# Patient Record
Sex: Female | Born: 1970
Health system: Southern US, Community
[De-identification: ages and names within clinical notes are randomized; demographics above are authoritative.]

## PROBLEM LIST (undated history)

## (undated) DIAGNOSIS — E119 Type 2 diabetes mellitus without complications: Secondary | ICD-10-CM

## (undated) DIAGNOSIS — M1990 Unspecified osteoarthritis, unspecified site: Secondary | ICD-10-CM

## (undated) DIAGNOSIS — I10 Essential (primary) hypertension: Secondary | ICD-10-CM

## (undated) DIAGNOSIS — T7840XA Allergy, unspecified, initial encounter: Secondary | ICD-10-CM

## (undated) HISTORY — DX: Unspecified osteoarthritis, unspecified site: M19.90

## (undated) HISTORY — PX: NO PAST SURGERIES: SHX2092

## (undated) HISTORY — DX: Allergy, unspecified, initial encounter: T78.40XA

---

## 1996-02-07 DIAGNOSIS — I10 Essential (primary) hypertension: Secondary | ICD-10-CM | POA: Insufficient documentation

## 2000-08-19 DIAGNOSIS — E119 Type 2 diabetes mellitus without complications: Secondary | ICD-10-CM | POA: Insufficient documentation

## 2007-08-15 ENCOUNTER — Ambulatory Visit: Payer: Self-pay | Admitting: Gastroenterology

## 2007-08-15 LAB — HM COLONOSCOPY

## 2009-06-24 DIAGNOSIS — D509 Iron deficiency anemia, unspecified: Secondary | ICD-10-CM | POA: Insufficient documentation

## 2010-08-04 ENCOUNTER — Ambulatory Visit: Payer: Self-pay | Admitting: Family Medicine

## 2011-08-25 ENCOUNTER — Ambulatory Visit: Payer: Self-pay | Admitting: Family Medicine

## 2012-10-30 ENCOUNTER — Ambulatory Visit: Payer: Self-pay | Admitting: Family Medicine

## 2013-10-31 ENCOUNTER — Ambulatory Visit: Payer: Self-pay | Admitting: Family Medicine

## 2013-12-27 LAB — HM PAP SMEAR: HM Pap smear: NORMAL

## 2014-11-12 ENCOUNTER — Ambulatory Visit: Payer: Self-pay | Admitting: Family Medicine

## 2014-11-12 LAB — HM MAMMOGRAPHY

## 2014-12-30 LAB — BASIC METABOLIC PANEL
BUN: 11 mg/dL (ref 4–21)
Creatinine: 0.8 mg/dL (ref <=1.1)
GLUCOSE: 127 mg/dL
Potassium: 4.3 mmol/L (ref 3.4–5.3)
SODIUM: 138 mmol/L (ref 137–147)

## 2014-12-30 LAB — LIPID PANEL
Cholesterol: 193 mg/dL (ref 0–200)
HDL: 77 mg/dL — AB (ref 35–70)
LDL Cholesterol: 104 mg/dL
Triglycerides: 61 mg/dL (ref 40–160)

## 2014-12-30 LAB — CBC AND DIFFERENTIAL
HCT: 36 % (ref 36–46)
HEMOGLOBIN: 11.5 g/dL — AB (ref 12.0–16.0)
Platelets: 361 10*3/uL (ref 150–399)
WBC: 7.1 10*3/mL

## 2014-12-30 LAB — HEPATIC FUNCTION PANEL
ALT: 14 U/L (ref 7–35)
AST: 14 U/L (ref 13–35)

## 2014-12-30 LAB — HEMOGLOBIN A1C: Hgb A1c MFr Bld: 5.3 % (ref 4.0–6.0)

## 2014-12-30 LAB — TSH: TSH: 3.85 u[IU]/mL (ref <=5.90)

## 2015-04-29 DIAGNOSIS — IMO0002 Reserved for concepts with insufficient information to code with codable children: Secondary | ICD-10-CM | POA: Insufficient documentation

## 2015-04-29 DIAGNOSIS — J309 Allergic rhinitis, unspecified: Secondary | ICD-10-CM | POA: Insufficient documentation

## 2015-04-29 DIAGNOSIS — Z86718 Personal history of other venous thrombosis and embolism: Secondary | ICD-10-CM | POA: Insufficient documentation

## 2015-04-29 DIAGNOSIS — R202 Paresthesia of skin: Secondary | ICD-10-CM | POA: Insufficient documentation

## 2015-06-30 ENCOUNTER — Encounter: Payer: Self-pay | Admitting: Family Medicine

## 2015-06-30 ENCOUNTER — Ambulatory Visit (INDEPENDENT_AMBULATORY_CARE_PROVIDER_SITE_OTHER): Payer: BLUE CROSS/BLUE SHIELD | Admitting: Family Medicine

## 2015-06-30 VITALS — BP 120/78 | HR 92 | Temp 98.2°F | Resp 16 | Ht 64.0 in | Wt 228.0 lb

## 2015-06-30 DIAGNOSIS — I10 Essential (primary) hypertension: Secondary | ICD-10-CM | POA: Diagnosis not present

## 2015-06-30 DIAGNOSIS — E119 Type 2 diabetes mellitus without complications: Secondary | ICD-10-CM

## 2015-06-30 LAB — POCT GLYCOSYLATED HEMOGLOBIN (HGB A1C)
ESTIMATED AVERAGE GLUCOSE: 94
Hemoglobin A1C: 4.9

## 2015-06-30 NOTE — Progress Notes (Signed)
Subjective:    Patient ID: Carolyn Nolan, female    DOB: 12-20-1970, 44 y.o.   MRN: [id number]  Hypertension This is a chronic problem. The problem is controlled. Pertinent negatives include no anxiety, blurred vision, chest pain, headaches, malaise/fatigue, neck pain, orthopnea, palpitations, peripheral edema, shortness of breath or sweats. Risk factors for coronary artery disease include diabetes mellitus and obesity. Treatments tried: Ramipril 2.5 mg and Maxzide 37.5-25 mg. There are no compliance problems.   Diabetes She presents for her follow-up diabetic visit. She has type 2 diabetes mellitus. Her disease course has been stable. Pertinent negatives for hypoglycemia include no headaches or sweats. Pertinent negatives for diabetes include no blurred vision, no chest pain, no fatigue, no foot paresthesias, no foot ulcerations, no polydipsia, no polyphagia, no polyuria, no visual change, no weakness and no weight loss. Current diabetic treatment includes oral agent (triple therapy) (Metformin 1000 mg, Glipizide 5 mg and Januvia 100 mg). She participates in exercise weekly. Home blood sugar record trend: not being checked. She does not see a podiatrist.Eye exam is current.  Last A1C was checked on 12/20/2014, and the result was 5.3%.    Review of Systems  Constitutional: Negative for weight loss, malaise/fatigue and fatigue.  Eyes: Negative for blurred vision.  Respiratory: Negative for shortness of breath.   Cardiovascular: Negative for chest pain, palpitations and orthopnea.  Endocrine: Negative for polydipsia, polyphagia and polyuria.  Musculoskeletal: Negative for neck pain.  Neurological: Negative for weakness and headaches.   BP 120/78 mmHg  Pulse 92  Temp(Src) 98.2 F (36.8 C) (Oral)  Resp 16  Ht  (1.626 m)  Wt 228 lb (103.42 kg)  BMI 39.12 kg/m2  LMP 06/24/2015 (Exact Date)   Patient Active Problem List   Diagnosis Date Noted  . Allergic rhinitis 04/29/2015  . Adult  BMI 30+ 04/29/2015  . Deep vein thrombosis 04/29/2015  . Burning or prickling sensation 04/29/2015  . Anemia, iron deficiency 06/24/2009  . Diabetes mellitus, type 2 08/19/2000  . BP (high blood pressure) 02/07/1996   No past medical history on file. Current Outpatient Prescriptions on File Prior to Visit  Medication Sig  . aspirin 81 MG tablet BAYER LOW STRENGTH,  (Oral Tablet Delayed Release)  1 Every Day for 0 days  Quantity: 0.00;  Refills: 0   Ordered :05-Oct-2010  Amie Critchley ;  Started 09-May-2007 Active Comments: DX: 250.00  . Calcium Carbonate-Vitamin D 600-200 MG-UNIT CAPS CALCIUM + D, 600-200MG -UNIT (Oral Tablet)  1 Twice Daily for 0 days  Quantity: 0.00;  Refills: 0   Ordered :05-Oct-2010  Kavin Leech ;  Started 03-July-2010 Active  . Cetirizine HCl 10 MG CAPS Take by mouth.  . cyclobenzaprine (FLEXERIL) 5 MG tablet Take by mouth.  . fluticasone (FLONASE) 50 MCG/ACT nasal spray FLONASE, 50MCG/ACT (Nasal Suspension)  two sprays each nostril, as needed for 0 days  Quantity: 0.00;  Refills: 0   Ordered :05-Oct-2010  Amie Critchley ;  Started 09-May-2007 Active Comments: Medication taken as needed. DX: 477.9  . glipiZIDE (GLUCOTROL) 5 MG tablet Take by mouth.  . IRON-VITAMINS PO Take by mouth.  . meloxicam (MOBIC) 15 MG tablet Take by mouth.  . metFORMIN (GLUCOPHAGE) 1000 MG tablet Take by mouth.  . MULTIPLE VITAMIN PO MULTIVITAMINS (Oral Tablet)  1 Every Day for 0 days  Quantity: 0.00;  Refills: 0   Ordered :05-Oct-2010  Kavin Leech ;  Started 18-Mar-2010 Active Comments: DX: 250.00  . Potassium 99 MG TABS Take by mouth.  Marland Kitchen  ramipril (ALTACE) 2.5 MG capsule Take by mouth.  . sitaGLIPtin (JANUVIA) 100 MG tablet Take by mouth.  . triamterene-hydrochlorothiazide (MAXZIDE-25) 37.5-25 MG per tablet Take by mouth.   No current facility-administered medications on file prior to visit.   No Known Allergies Past Surgical History  Procedure Laterality Date  . No past  surgeries     History   Social History  . Marital Status: Single    Spouse Name: N/A  . Number of Children: N/A  . Years of Education: N/A   Occupational History  . Not on file.   Social History Main Topics  . Smoking status: Never Smoker   . Smokeless tobacco: Never Used  . Alcohol Use: Yes     Comment: Occasional Wine  . Drug Use: No  . Sexual Activity: Not on file   Other Topics Concern  . Not on file   Social History Narrative   Family History  Problem Relation Age of Onset  . Diabetes Mother   . Diabetes Father   . Hypertension Father        Objective:   Physical Exam  Constitutional: She is oriented to person, place, and time. She appears well-developed and well-nourished.  Cardiovascular: Normal rate and regular rhythm.   Pulmonary/Chest: Effort normal and breath sounds normal.  Neurological: She is alert and oriented to person, place, and time.  Psychiatric: She has a normal mood and affect. Her behavior is normal. Judgment and thought content normal.   BP 120/78 mmHg  Pulse 92  Temp(Src) 98.2 F (36.8 C) (Oral)  Resp 16  Ht 5\' 4"  (1.626 m)  Wt 228 lb (103.42 kg)  BMI 39.12 kg/m2  LMP 06/24/2015 (Exact Date)      Assessment & Plan:  1. Essential hypertension Stable. Continue current medication and plan of care.   2. Type 2 diabetes mellitus without complication Results for orders placed or performed in visit on 06/30/15  POCT glycosylated hemoglobin (Hb A1C)  Result Value Ref Range   Hemoglobin A1C 4.9    Est. average glucose Bld gHb Est-mCnc 94    Doing great. Will stop Glipizide. Recheck in 3 months.   Lorie PhenixNancy Nylene Inlow, MD

## 2015-09-05 ENCOUNTER — Other Ambulatory Visit: Payer: Self-pay | Admitting: Family Medicine

## 2015-09-05 DIAGNOSIS — E119 Type 2 diabetes mellitus without complications: Secondary | ICD-10-CM

## 2015-09-28 ENCOUNTER — Other Ambulatory Visit: Payer: Self-pay | Admitting: Family Medicine

## 2015-09-28 DIAGNOSIS — I10 Essential (primary) hypertension: Secondary | ICD-10-CM

## 2015-09-30 LAB — HM DIABETES EYE EXAM

## 2015-10-06 ENCOUNTER — Other Ambulatory Visit: Payer: Self-pay | Admitting: Family Medicine

## 2015-10-06 DIAGNOSIS — M940 Chondrocostal junction syndrome [Tietze]: Secondary | ICD-10-CM | POA: Insufficient documentation

## 2015-10-06 NOTE — Telephone Encounter (Signed)
LOV 06/30/2015. Allene DillonEmily Drozdowski, CMA

## 2015-12-10 ENCOUNTER — Telehealth: Payer: Self-pay | Admitting: Family Medicine

## 2015-12-10 ENCOUNTER — Other Ambulatory Visit: Payer: Self-pay

## 2015-12-10 NOTE — Telephone Encounter (Signed)
Received note from her pharmacy. New recommendation recommend she start a cholesterol medication secondary to her diabetes. Please see if patient would like to start a cholesterol medication to lower risk of heart disease or discuss at follow up.   Thanks.

## 2015-12-10 NOTE — Telephone Encounter (Signed)
Patient does not want to start cholesterol medication. sd

## 2015-12-10 NOTE — Telephone Encounter (Signed)
LMTCB. sd  

## 2015-12-19 ENCOUNTER — Other Ambulatory Visit: Payer: Self-pay | Admitting: Family Medicine

## 2016-01-02 ENCOUNTER — Encounter: Payer: Self-pay | Admitting: Family Medicine

## 2016-01-02 ENCOUNTER — Ambulatory Visit (INDEPENDENT_AMBULATORY_CARE_PROVIDER_SITE_OTHER): Payer: Managed Care, Other (non HMO) | Admitting: Family Medicine

## 2016-01-02 VITALS — BP 120/78 | HR 96 | Temp 97.9°F | Resp 16 | Ht 64.0 in | Wt 240.0 lb

## 2016-01-02 DIAGNOSIS — E119 Type 2 diabetes mellitus without complications: Secondary | ICD-10-CM | POA: Diagnosis not present

## 2016-01-02 DIAGNOSIS — Z1211 Encounter for screening for malignant neoplasm of colon: Secondary | ICD-10-CM | POA: Diagnosis not present

## 2016-01-02 DIAGNOSIS — J3089 Other allergic rhinitis: Secondary | ICD-10-CM | POA: Diagnosis not present

## 2016-01-02 DIAGNOSIS — Z1239 Encounter for other screening for malignant neoplasm of breast: Secondary | ICD-10-CM | POA: Diagnosis not present

## 2016-01-02 DIAGNOSIS — Z0000 Encounter for general adult medical examination without abnormal findings: Secondary | ICD-10-CM

## 2016-01-02 DIAGNOSIS — I10 Essential (primary) hypertension: Secondary | ICD-10-CM | POA: Diagnosis not present

## 2016-01-02 LAB — POCT URINALYSIS DIPSTICK
BILIRUBIN UA: NEGATIVE
Blood, UA: NEGATIVE
GLUCOSE UA: NEGATIVE
Ketones, UA: NEGATIVE
LEUKOCYTES UA: NEGATIVE
NITRITE UA: NEGATIVE
Protein, UA: NEGATIVE
Spec Grav, UA: <=1.005
Urobilinogen, UA: 0.2
pH, UA: 6

## 2016-01-02 LAB — IFOBT (OCCULT BLOOD): IFOBT: NEGATIVE

## 2016-01-02 MED ORDER — MONTELUKAST SODIUM 10 MG PO TABS: 10.0000 mg | ORAL_TABLET | Freq: Every day | ORAL | Status: DC

## 2016-01-02 NOTE — Patient Instructions (Signed)
Please call the [facility] at [facility] to schedule this at [phone]   

## 2016-01-02 NOTE — Progress Notes (Signed)
Patient ID: Carolyn Nolan, female   DOB: 1971/08/29, 45 y.o.   MRN: [id number]       Patient: Carolyn Nolan, Female    DOB: [birth year], 45 y.o.   MRN: [id number] Visit Date: 01/02/2016  Today's Provider: Lorie Phenix, MD   Chief Complaint  Patient presents with  . Annual Exam  . Diabetes   Subjective:    Annual physical exam Carolyn Nolan is a 45 y.o. female who presents today for health maintenance and complete physical. She feels well. She reports exercising daily. She reports she is sleeping fairly well. 12/30/14 CPE 12/27/13 Pap-neg; HPV-neg 11/12/14 Mammo-BI-RADS 1 08/15/07 Colon-int hemorrhoids  Lab Results  Component Value Date   WBC 7.1 12/30/2014   HGB 11.5* 12/30/2014   HCT 36 12/30/2014   PLT 361 12/30/2014   CHOL 193 12/30/2014   TRIG 61 12/30/2014   HDL 77* 12/30/2014   LDLCALC 104 12/30/2014   ALT 14 12/30/2014   AST 14 12/30/2014   NA 138 12/30/2014   K 4.3 12/30/2014   CREATININE 0.8 12/30/2014   BUN 11 12/30/2014   TSH 3.85 12/30/2014   HGBA1C 4.9 06/30/2015   -----------------------------------------------------------------  Diabetes Mellitus Type II, Follow-up:   Lab Results  Component Value Date   HGBA1C 4.9 06/30/2015   HGBA1C 5.3 12/30/2014    Last seen for diabetes 6 months ago.  Management since then includes no changes. She reports excellent compliance with treatment. She is not having side effects.  Current symptoms include none and have been stable. Home blood sugar records: not being checked  Episodes of hypoglycemia? no   Current Insulin Regimen: none Most Recent Eye Exam: ut to date Weight trend: stable Prior visit with dietician: no Current diet: in general, a "healthy" diet   Current exercise: walking  Pertinent Labs:    Component Value Date/Time   CHOL 193 12/30/2014   TRIG 61 12/30/2014   HDL 77* 12/30/2014   LDLCALC 104 12/30/2014   CREATININE 0.8 12/30/2014    Wt Readings from Last 3 Encounters:    01/02/16 240 lb (108.863 kg)  06/30/15 228 lb (103.42 kg)  12/30/14 225 lb (102.059 kg)    ------------------------------------------------------------------------     Review of Systems  Constitutional: Negative.   HENT: Positive for sinus pressure.   Eyes: Negative.   Respiratory: Negative.   Cardiovascular: Negative.   Gastrointestinal: Negative.   Endocrine: Positive for cold intolerance.  Genitourinary: Negative.   Musculoskeletal: Negative.   Skin: Negative.   Allergic/Immunologic: Positive for environmental allergies.  Neurological: Negative.   Hematological: Negative.   Psychiatric/Behavioral: Negative.     Social History      She  reports that she has never smoked. She has never used smokeless tobacco. She reports that she drinks alcohol. She reports that she does not use illicit drugs.       Social History   Social History  . Marital Status: Single    Spouse Name: N/A  . Number of Children: N/A  . Years of Education: N/A   Social History Main Topics  . Smoking status: Never Smoker   . Smokeless tobacco: Never Used  . Alcohol Use: Yes     Comment: Occasional Wine  . Drug Use: No  . Sexual Activity: Not Asked   Other Topics Concern  . None   Social History Narrative    History reviewed. No pertinent past medical history.   Patient Active Problem List   Diagnosis Date Noted  . Diabetes  mellitus without complication (HCC) 01/02/2016  . Costalchondritis 10/06/2015  . Allergic rhinitis 04/29/2015  . Adult BMI 30+ 04/29/2015  . Deep vein thrombosis (HCC) 04/29/2015  . Burning or prickling sensation 04/29/2015  . Anemia, iron deficiency 06/24/2009  . Diabetes mellitus, type 2 (HCC) 08/19/2000  . BP (high blood pressure) 02/07/1996    Past Surgical History  Procedure Laterality Date  . No past surgeries      Family History        Family Status  Relation Status Death Age  . Mother Alive   . Father Deceased   . Sister Alive         Her  family history includes Diabetes in her father and mother; Healthy in her sister; Hypertension in her father.    No Known Allergies  Previous Medications   ASPIRIN 81 MG TABLET    BAYER LOW STRENGTH,  (Oral Tablet Delayed Release)  1 Every Day for 0 days  Quantity: 0.00;  Refills: 0   Ordered :05-Oct-2010  Amie Critchley ;  Started 09-May-2007 Active Comments: DX: 250.00   CALCIUM CARBONATE-VITAMIN D 600-200 MG-UNIT CAPS    CALCIUM + D, 600-200MG -UNIT (Oral Tablet)  1 Twice Daily for 0 days  Quantity: 0.00;  Refills: 0   Ordered :05-Oct-2010  Kavin Leech ;  Started 03-July-2010 Active   CETIRIZINE HCL 10 MG CAPS    Take by mouth.   CYCLOBENZAPRINE (FLEXERIL) 5 MG TABLET    take 1 tablet by mouth at bedtime if needed for MUSCLE SPASMS   FLUTICASONE (FLONASE) 50 MCG/ACT NASAL SPRAY    FLONASE, 50MCG/ACT (Nasal Suspension)  two sprays each nostril, as needed for 0 days  Quantity: 0.00;  Refills: 0   Ordered :05-Oct-2010  Amie Critchley ;  Started 09-May-2007 Active Comments: Medication taken as needed. DX: 477.9   IRON-VITAMINS PO    Take by mouth.   JANUVIA 100 MG TABLET    take 1 tablet by mouth once daily   MELOXICAM (MOBIC) 15 MG TABLET    take 1 tablet by mouth once daily if needed   METFORMIN (GLUCOPHAGE) 1000 MG TABLET    take 1 tablet by mouth twice a day   MULTIPLE VITAMIN PO    MULTIVITAMINS (Oral Tablet)  1 Every Day for 0 days  Quantity: 0.00;  Refills: 0   Ordered :05-Oct-2010  Kavin Leech ;  Started 18-Mar-2010 Active Comments: DX: 250.00   POTASSIUM 99 MG TABS    Take by mouth.   RAMIPRIL (ALTACE) 2.5 MG CAPSULE    take 1 capsule by mouth daily   TRIAMTERENE-HYDROCHLOROTHIAZIDE (MAXZIDE-25) 37.5-25 MG PER TABLET    Take by mouth.    Patient Care Team: Lorie Phenix, MD as PCP - General (Family Medicine)     Objective:   Vitals: BP 120/78 mmHg  Pulse 96  Temp(Src) 97.9 F (36.6 C) (Oral)  Resp 16  Ht  (1.626 m)  Wt 240 lb (108.863 kg)  BMI 41.18 kg/m2   SpO2 100%  LMP 12/27/2015 (Exact Date)   Physical Exam  Constitutional: She is oriented to person, place, and time. She appears well-developed and well-nourished.  HENT:  Head: Normocephalic and atraumatic.  Right Ear: Tympanic membrane, external ear and ear canal normal.  Left Ear: Tympanic membrane, external ear and ear canal normal.  Nose: Mucosal edema present.  Mouth/Throat: Uvula is midline, oropharynx is clear and moist and mucous membranes are normal.  Eyes: Conjunctivae, EOM and lids are normal. Pupils are equal,  round, and reactive to light.  Neck: Trachea normal and normal range of motion. Neck supple. Carotid bruit is not present. No thyroid mass and no thyromegaly present.  Cardiovascular: Normal rate, regular rhythm and normal heart sounds.   Pulmonary/Chest: Effort normal and breath sounds normal.  Abdominal: Soft. Normal appearance and bowel sounds are normal. There is no hepatosplenomegaly. There is no tenderness.  Genitourinary: Rectum normal. No breast swelling, tenderness or discharge.  Musculoskeletal: Normal range of motion.  Lymphadenopathy:    She has no cervical adenopathy.    She has no axillary adenopathy.  Neurological: She is alert and oriented to person, place, and time. She has normal strength. No cranial nerve deficit.  Skin: Skin is warm, dry and intact.  Psychiatric: She has a normal mood and affect. Her speech is normal and behavior is normal. Judgment and thought content normal. Cognition and memory are normal.     Depression Screen PHQ 2/9 Scores 01/02/2016  PHQ - 2 Score 1      Assessment & Plan:     Routine Health Maintenance and Physical Exam  Exercise Activities and Dietary recommendations Goals    . Exercise 150 minutes per week (moderate activity)       Immunization History  Administered Date(s) Administered  . Influenza-Unspecified 10/14/2015  . Pneumococcal Polysaccharide-23 10/15/2011  . Tdap 03/10/2006      1. Annual  physical exam Stable. Patient advised to continue eating healthy and exercise daily. - POCT urinalysis dipstick  2. Diabetes mellitus without complication (HCC) Stable. Patient advised to continue current medication and plan of care.  Diabetic Foot Exam - Simple   Simple Foot Form  Diabetic Foot exam was performed with the following findings:  Yes 01/02/2016  9:38 AM  Visual Inspection  No deformities, no ulcerations, no other skin breakdown bilaterally:  Yes  Sensation Testing  Intact to touch and monofilament testing bilaterally:  Yes  Pulse Check  Posterior Tibialis and Dorsalis pulse intact bilaterally:  Yes  Comments    - Hemoglobin A1c - TSH  3. Other allergic rhinitis Worsening. Patient started on montelukast 10 mg daily as below. - montelukast (SINGULAIR) 10 MG tablet; Take 1 tablet (10 mg total) by mouth at bedtime.  Dispense: 30 tablet; Refill: 3  4. Breast cancer screening - MM Digital Diagnostic Bilat; Future  5. Colon cancer screening - IFOBT POC (occult bld, rslt in office)  6. Essential hypertension - CBC with Differential/Platelet - Comprehensive metabolic panel - Lipid Panel With LDL/HDL Ratio     Patient seen and examined by Dr. Leo Grosser, and note scribed by Liz Beach. Dimas, CMA. I have reviewed the document for accuracy and completeness and I agree with above. Leo Grosser, MD   Lorie Phenix, MD    --------------------------------------------------------------------

## 2016-01-03 LAB — CBC WITH DIFFERENTIAL/PLATELET
BASOS ABS: 0 10*3/uL (ref 0.0–0.2)
Basos: 0 %
EOS (ABSOLUTE): 0.1 10*3/uL (ref 0.0–0.4)
Eos: 1 %
HEMOGLOBIN: 10.8 g/dL — AB (ref 11.1–15.9)
Hematocrit: 33.1 % — ABNORMAL LOW (ref 34.0–46.6)
IMMATURE GRANS (ABS): 0 10*3/uL (ref 0.0–0.1)
IMMATURE GRANULOCYTES: 0 %
LYMPHS: 25 %
Lymphocytes Absolute: 1.9 10*3/uL (ref 0.7–3.1)
MCH: 28.4 pg (ref 26.6–33.0)
MCHC: 32.6 g/dL (ref 31.5–35.7)
MCV: 87 fL (ref 79–97)
MONOCYTES: 8 %
Monocytes Absolute: 0.6 10*3/uL (ref 0.1–0.9)
NEUTROS ABS: 5 10*3/uL (ref 1.4–7.0)
NEUTROS PCT: 66 %
PLATELETS: 406 10*3/uL — AB (ref 150–379)
RBC: 3.8 x10E6/uL (ref 3.77–5.28)
RDW: 13.4 % (ref 12.3–15.4)
WBC: 7.4 10*3/uL (ref 3.4–10.8)

## 2016-01-03 LAB — COMPREHENSIVE METABOLIC PANEL
A/G RATIO: 1.3 (ref 1.1–2.5)
ALT: 24 IU/L (ref 0–32)
AST: 20 IU/L (ref 0–40)
Albumin: 4 g/dL (ref 3.5–5.5)
Alkaline Phosphatase: 50 IU/L (ref 39–117)
BILIRUBIN TOTAL: 0.2 mg/dL (ref 0.0–1.2)
BUN/Creatinine Ratio: 16 (ref 9–23)
BUN: 13 mg/dL (ref 6–24)
CHLORIDE: 100 mmol/L (ref 96–106)
CO2: 25 mmol/L (ref 18–29)
Calcium: 9.4 mg/dL (ref 8.7–10.2)
Creatinine, Ser: 0.82 mg/dL (ref 0.57–1.00)
GFR calc Af Amer: 100 mL/min/{1.73_m2} (ref >59)
GFR calc non Af Amer: 87 mL/min/{1.73_m2} (ref >59)
GLUCOSE: 111 mg/dL — AB (ref 65–99)
Globulin, Total: 3.2 g/dL (ref 1.5–4.5)
POTASSIUM: 3.9 mmol/L (ref 3.5–5.2)
Sodium: 141 mmol/L (ref 134–144)
Total Protein: 7.2 g/dL (ref 6.0–8.5)

## 2016-01-03 LAB — LIPID PANEL WITH LDL/HDL RATIO
CHOLESTEROL TOTAL: 187 mg/dL (ref 100–199)
HDL: 75 mg/dL (ref >39)
LDL Calculated: 101 mg/dL — ABNORMAL HIGH (ref 0–99)
LDl/HDL Ratio: 1.3 ratio units (ref 0.0–3.2)
Triglycerides: 56 mg/dL (ref 0–149)
VLDL Cholesterol Cal: 11 mg/dL (ref 5–40)

## 2016-01-03 LAB — HEMOGLOBIN A1C
ESTIMATED AVERAGE GLUCOSE: 120 mg/dL
HEMOGLOBIN A1C: 5.8 % — AB (ref 4.8–5.6)

## 2016-01-03 LAB — TSH: TSH: 1.61 u[IU]/mL (ref 0.450–4.500)

## 2016-01-05 ENCOUNTER — Ambulatory Visit
Admission: RE | Admit: 2016-01-05 | Discharge: 2016-01-05 | Disposition: A | Discharge status: Home / Self Care | Payer: Managed Care, Other (non HMO) | Source: Ambulatory Visit | Attending: Family Medicine | Admitting: Family Medicine

## 2016-01-05 ENCOUNTER — Telehealth: Payer: Self-pay

## 2016-01-05 DIAGNOSIS — Z1239 Encounter for other screening for malignant neoplasm of breast: Secondary | ICD-10-CM

## 2016-01-05 DIAGNOSIS — Z1231 Encounter for screening mammogram for malignant neoplasm of breast: Secondary | ICD-10-CM | POA: Diagnosis present

## 2016-01-05 NOTE — Telephone Encounter (Signed)
Advised pt of lab results. Pt verbally acknowledges understanding. Pt is taking iron. Allene Dillon, CMA

## 2016-01-05 NOTE — Telephone Encounter (Signed)
-----   Message from Lorie Phenix, MD sent at 01/04/2016  9:07 AM EST ----- Labs look good. HgbA1c is 5.8. Mildly anemic.  Make sure she is taking her iron.   Thanks.

## 2016-01-27 ENCOUNTER — Other Ambulatory Visit: Payer: Self-pay | Admitting: Family Medicine

## 2016-01-27 DIAGNOSIS — I10 Essential (primary) hypertension: Secondary | ICD-10-CM

## 2016-02-20 ENCOUNTER — Other Ambulatory Visit: Payer: Self-pay | Admitting: Family Medicine

## 2016-02-20 DIAGNOSIS — E119 Type 2 diabetes mellitus without complications: Secondary | ICD-10-CM

## 2016-03-26 ENCOUNTER — Other Ambulatory Visit: Payer: Self-pay | Admitting: Family Medicine

## 2016-03-26 DIAGNOSIS — I10 Essential (primary) hypertension: Secondary | ICD-10-CM

## 2016-04-01 ENCOUNTER — Encounter: Payer: Self-pay | Admitting: Family Medicine

## 2016-04-01 ENCOUNTER — Ambulatory Visit (INDEPENDENT_AMBULATORY_CARE_PROVIDER_SITE_OTHER): Payer: Managed Care, Other (non HMO) | Admitting: Family Medicine

## 2016-04-01 VITALS — BP 126/80 | HR 84 | Temp 98.5°F | Resp 16 | Wt 239.0 lb

## 2016-04-01 DIAGNOSIS — E119 Type 2 diabetes mellitus without complications: Secondary | ICD-10-CM | POA: Diagnosis not present

## 2016-04-01 DIAGNOSIS — I10 Essential (primary) hypertension: Secondary | ICD-10-CM | POA: Diagnosis not present

## 2016-04-01 DIAGNOSIS — M722 Plantar fascial fibromatosis: Secondary | ICD-10-CM

## 2016-04-01 LAB — POCT GLYCOSYLATED HEMOGLOBIN (HGB A1C): Hemoglobin A1C: 5.4

## 2016-04-01 NOTE — Progress Notes (Signed)
Patient ID: Carolyn Nolan, female   DOB: Mar 08, 1971, 45 y.o.   MRN: [id number]         Patient: Carolyn Nolan Female    DOB: 05/22/1971   45 y.o.   MRN: [id number] Visit Date: 04/01/2016  Today's Provider: Lorie Phenix, MD   Chief Complaint  Patient presents with  . Hypertension  . Diabetes   Subjective:    HPI    Diabetes Mellitus Type II, Follow-up:   Lab Results  Component Value Date   HGBA1C 5.8* 01/02/2016   HGBA1C 4.9 06/30/2015   HGBA1C 5.3 12/30/2014   Last seen for diabetes 3 months ago.  Management since then includes None. She reports excellent compliance with treatment. She is not having side effects.  Current symptoms include none and have been stable. Home blood sugar records: Not being checked at home.   Episodes of hypoglycemia? no   Most Recent Eye Exam: 09/2015 Weight trend: stable Current diet: in general, a "healthy" diet   Current exercise: Has not been exercising in the last month; but plans on walking soon.   ------------------------------------------------------------------------   Hypertension, follow-up:  BP Readings from Last 3 Encounters:  04/01/16 126/80  01/02/16 120/78  06/30/15 120/78    She was last seen for hypertension 3 months ago.  Management since that visit includes None .She reports excellent compliance with treatment. She is not having side effects.  She is not exercising. She is adherent to low salt diet.   She is experiencing none.  Patient denies chest pain, fatigue, lower extremity edema, palpitations and tachypnea.   ------------------------------------------------------------------------      Pt also reports having trouble with bilateral feet pain.  She states the pain comes and goes; and is located on the bottom of her feet.  She is concerned that is could be plantar fasciitis.        No Known Allergies Previous Medications   ASPIRIN 81 MG TABLET    BAYER LOW STRENGTH,  (Oral Tablet Delayed  Release)  1 Every Day for 0 days  Quantity: 0.00;  Refills: 0   Ordered :05-Oct-2010  Amie Critchley ;  Started 09-May-2007 Active Comments: DX: 250.00   CALCIUM CARBONATE-VITAMIN D 600-200 MG-UNIT CAPS    CALCIUM + D, 600-200MG -UNIT (Oral Tablet)  1 Twice Daily for 0 days  Quantity: 0.00;  Refills: 0   Ordered :05-Oct-2010  Kavin Leech ;  Started 03-July-2010 Active   CETIRIZINE HCL 10 MG CAPS    Take by mouth.   CYCLOBENZAPRINE (FLEXERIL) 5 MG TABLET    take 1 tablet by mouth at bedtime if needed for MUSCLE SPASMS   FLUTICASONE (FLONASE) 50 MCG/ACT NASAL SPRAY    FLONASE, 50MCG/ACT (Nasal Suspension)  two sprays each nostril, as needed for 0 days  Quantity: 0.00;  Refills: 0   Ordered :05-Oct-2010  Amie Critchley ;  Started 09-May-2007 Active Comments: Medication taken as needed. DX: 477.9   IRON-VITAMINS PO    Take by mouth.   JANUVIA 100 MG TABLET    take 1 tablet by mouth once daily   MELOXICAM (MOBIC) 15 MG TABLET    take 1 tablet by mouth once daily if needed   METFORMIN (GLUCOPHAGE) 1000 MG TABLET    take 1 tablet by mouth twice a day with food   MONTELUKAST (SINGULAIR) 10 MG TABLET    Take 1 tablet (10 mg total) by mouth at bedtime.   MULTIPLE VITAMIN PO    MULTIVITAMINS (Oral Tablet)  1 Every  Day for 0 days  Quantity: 0.00;  Refills: 0   Ordered :05-Oct-2010  Kavin LeechWalsh, Laura ;  Started 18-Mar-2010 Active Comments: DX: 250.00   POTASSIUM 99 MG TABS    Take by mouth.   RAMIPRIL (ALTACE) 2.5 MG CAPSULE    take 1 capsule by mouth once daily   TRIAMTERENE-HYDROCHLOROTHIAZIDE (MAXZIDE-25) 37.5-25 MG TABLET    take 1 tablet by mouth once daily    Review of Systems  Constitutional: Negative.   Respiratory: Negative.   Cardiovascular: Negative.   Gastrointestinal: Negative.   Musculoskeletal: Positive for arthralgias (Bilateral feet pain.  ). Negative for myalgias, back pain, joint swelling, gait problem and neck stiffness.  Neurological: Negative for light-headedness.    Social  History  Substance Use Topics  . Smoking status: Never Smoker   . Smokeless tobacco: Never Used  . Alcohol Use: Yes     Comment: Occasional Wine   Objective:   BP 126/80 mmHg  Pulse 84  Temp(Src) 98.5 F (36.9 C) (Oral)  Resp 16  Wt 239 lb (108.41 kg)  Results for orders placed or performed in visit on 04/01/16  POCT glycosylated hemoglobin (Hb A1C)  Result Value Ref Range   Hemoglobin A1C 5.4      Physical Exam  Constitutional: She is oriented to person, place, and time. She appears well-developed and well-nourished.  Cardiovascular: Normal rate and regular rhythm.   Pulmonary/Chest: Effort normal and breath sounds normal.  Neurological: She is alert and oriented to person, place, and time.  Skin: Skin is warm and dry.  Psychiatric: She has a normal mood and affect. Her behavior is normal. Judgment and thought content normal.        Assessment & Plan:      1. Essential hypertension Stable; continue current medication. New p  2. Diabetes mellitus without complication (HCC) A1C stable at 5.4%;  Recheck in four months with Antony ContrasJenni. - POCT glycosylated hemoglobin (Hb A1C)  3. Plantar fasciitis, bilateral New problem  Worsening; gave handout on exercises.         Patient was seen and examined by Leo GrosserNancy J. Emonie Espericueta, MD, and note scribed by Kavin LeechLaura Walsh, CMA. Patient was seen and examined by Leo GrosserNancy J. Jakylan Ron, MD, and note scribed by Kavin LeechLaura Walsh, CMA.     Lorie PhenixNancy Ovella Manygoats, MD  [facility]

## 2016-04-01 NOTE — Patient Instructions (Signed)

## 2016-04-23 ENCOUNTER — Other Ambulatory Visit: Payer: Self-pay | Admitting: Family Medicine

## 2016-04-23 DIAGNOSIS — J3089 Other allergic rhinitis: Secondary | ICD-10-CM

## 2016-08-02 ENCOUNTER — Encounter: Payer: Self-pay | Admitting: Physician Assistant

## 2016-08-02 ENCOUNTER — Ambulatory Visit (INDEPENDENT_AMBULATORY_CARE_PROVIDER_SITE_OTHER): Payer: Managed Care, Other (non HMO) | Admitting: Physician Assistant

## 2016-08-02 VITALS — BP 136/98 | HR 90 | Temp 98.0°F | Resp 16 | Wt 253.0 lb

## 2016-08-02 DIAGNOSIS — E119 Type 2 diabetes mellitus without complications: Secondary | ICD-10-CM

## 2016-08-02 LAB — POCT GLYCOSYLATED HEMOGLOBIN (HGB A1C): HEMOGLOBIN A1C: 5.6

## 2016-08-02 NOTE — Patient Instructions (Signed)

## 2016-08-02 NOTE — Progress Notes (Signed)
Patient: Carolyn MeiersStacy G Mecca Female    DOB: 10-17-1971   45 y.o.   MRN: [id number] Visit Date: 08/02/2016  Today's Provider: Margaretann LovelessJennifer M Burnette, PA-C   Chief Complaint  Patient presents with  . Diabetes  . Hypertension  . Follow-up   Subjective:    HPI  Diabetes Mellitus Type II, Follow-up:   Lab Results  Component Value Date   HGBA1C 5.6 08/02/2016   HGBA1C 5.4 04/01/2016   HGBA1C 5.8 (H) 01/02/2016   Last seen for diabetes 4 months ago.  Management since then includes continue Metformin and Januvia. She reports good compliance with treatment. She is not having side effects.  Current symptoms include none and have been stable. Home blood sugar records: not being checked  Episodes of hypoglycemia? unknown    Weight trend: stable Current diet: well balanced Current exercise: lightly  ------------------------------------------------------------------------   Hypertension, follow-up:  BP Readings from Last 3 Encounters:  08/02/16 (!) 136/98  04/01/16 126/80  01/02/16 120/78    She was last seen for hypertension 4 months ago.  BP at that visit was 126/80. Management since that visit includes continue maxzide .She reports good compliance with treatment. She is not having side effects.  She lightly exercising. She is adherent to low salt diet.   Outside blood pressures are not being checked. She is experiencing none.  Patient denies none.   Cardiovascular risk factors include diabetes mellitus, hypertension and obesity (BMI >= 30 kg/m2).  Use of agents associated with hypertension: none.   ------------------------------------------------------------------------      Previous Medications   ASPIRIN 81 MG TABLET    BAYER LOW STRENGTH, 81MG  (Oral Tablet Delayed Release)  1 Every Day for 0 days  Quantity: 0.00;  Refills: 0   Ordered :05-Oct-2010  Amie CritchleyMitchell, Stephanie ;  Started 09-May-2007 Active Comments: DX: 250.00   CALCIUM CARBONATE-VITAMIN D 600-200 MG-UNIT  CAPS    CALCIUM + D, 600-200MG -UNIT (Oral Tablet)  1 Twice Daily for 0 days  Quantity: 0.00;  Refills: 0   Ordered :05-Oct-2010  Kavin LeechWalsh, Laura ;  Started 03-July-2010 Active   CETIRIZINE HCL 10 MG CAPS    Take by mouth.   CYCLOBENZAPRINE (FLEXERIL) 5 MG TABLET    take 1 tablet by mouth at bedtime if needed for MUSCLE SPASMS   FLUTICASONE (FLONASE) 50 MCG/ACT NASAL SPRAY    FLONASE, 50MCG/ACT (Nasal Suspension)  two sprays each nostril, as needed for 0 days  Quantity: 0.00;  Refills: 0   Ordered :05-Oct-2010  Amie CritchleyMitchell, Stephanie ;  Started 09-May-2007 Active Comments: Medication taken as needed. DX: 477.9   IRON-VITAMINS PO    Take by mouth.   JANUVIA 100 MG TABLET    take 1 tablet by mouth once daily   MELOXICAM (MOBIC) 15 MG TABLET    take 1 tablet by mouth once daily if needed   METFORMIN (GLUCOPHAGE) 1000 MG TABLET    take 1 tablet by mouth twice a day with food   MONTELUKAST (SINGULAIR) 10 MG TABLET    take 1 tablet by mouth at bedtime   MULTIPLE VITAMIN PO    MULTIVITAMINS (Oral Tablet)  1 Every Day for 0 days  Quantity: 0.00;  Refills: 0   Ordered :05-Oct-2010  Kavin LeechWalsh, Laura ;  Started 18-Mar-2010 Active Comments: DX: 250.00   POTASSIUM 99 MG TABS    Take by mouth.   RAMIPRIL (ALTACE) 2.5 MG CAPSULE    take 1 capsule by mouth once daily   TRIAMTERENE-HYDROCHLOROTHIAZIDE (MAXZIDE-25) 37.5-25 MG TABLET  take 1 tablet by mouth once daily    Review of Systems  Constitutional: Negative.   Respiratory: Negative.   Cardiovascular: Negative.   Gastrointestinal: Negative.   Endocrine: Negative.     Social History  Substance Use Topics  . Smoking status: Never Smoker  . Smokeless tobacco: Never Used  . Alcohol use Yes     Comment: Occasional Wine   Objective:   BP (!) 136/98 (BP Location: Right Arm, Patient Position: Sitting, Cuff Size: Large)   Pulse 90   Temp 98 F (36.7 C) (Oral)   Resp 16   Wt 253 lb (114.8 kg)   SpO2 99%   BMI 43.43 kg/m   Physical Exam  Constitutional: She  appears well-developed and well-nourished. No distress.  Neck: Normal range of motion. Neck supple. No JVD present. No tracheal deviation present. No thyromegaly present.  Cardiovascular: Normal rate, regular rhythm and normal heart sounds.  Exam reveals no gallop and no friction rub.   No murmur heard. Pulmonary/Chest: Effort normal and breath sounds normal. No respiratory distress. She has no wheezes. She has no rales.  Musculoskeletal: She exhibits no edema.  Lymphadenopathy:    She has no cervical adenopathy.  Skin: She is not diaphoretic.  Psychiatric: She has a normal mood and affect. Her behavior is normal. Judgment and thought content normal.  Vitals reviewed.     Assessment & Plan:     1. Type 2 diabetes mellitus without complication, without long-term current use of insulin (HCC) Fairly stable. Slight increase to 5.6 from 5.4. Discussed lifestyle modifications. Will see her back in January 2018 for CPE and all labs. She is to call the office if she develops any acute issues, questions and concerns.  - POCT HgB A1C   Follow up: No Follow-up on file.

## 2016-08-16 ENCOUNTER — Other Ambulatory Visit: Payer: Self-pay | Admitting: Family Medicine

## 2016-08-16 DIAGNOSIS — E119 Type 2 diabetes mellitus without complications: Secondary | ICD-10-CM

## 2016-08-29 ENCOUNTER — Other Ambulatory Visit: Payer: Self-pay | Admitting: Family Medicine

## 2016-08-29 DIAGNOSIS — I10 Essential (primary) hypertension: Secondary | ICD-10-CM

## 2016-09-29 ENCOUNTER — Other Ambulatory Visit: Payer: Self-pay | Admitting: Family Medicine

## 2016-09-29 DIAGNOSIS — I10 Essential (primary) hypertension: Secondary | ICD-10-CM

## 2016-10-07 ENCOUNTER — Encounter: Payer: Self-pay | Admitting: Physician Assistant

## 2016-11-21 ENCOUNTER — Other Ambulatory Visit: Payer: Self-pay | Admitting: Family Medicine

## 2017-01-03 ENCOUNTER — Ambulatory Visit (INDEPENDENT_AMBULATORY_CARE_PROVIDER_SITE_OTHER): Payer: Managed Care, Other (non HMO) | Admitting: Physician Assistant

## 2017-01-03 ENCOUNTER — Encounter: Payer: Self-pay | Admitting: Physician Assistant

## 2017-01-03 VITALS — BP 130/86 | HR 68 | Temp 97.6°F | Resp 16 | Wt 270.0 lb

## 2017-01-03 DIAGNOSIS — Z124 Encounter for screening for malignant neoplasm of cervix: Secondary | ICD-10-CM | POA: Diagnosis not present

## 2017-01-03 DIAGNOSIS — Z1231 Encounter for screening mammogram for malignant neoplasm of breast: Secondary | ICD-10-CM | POA: Diagnosis not present

## 2017-01-03 DIAGNOSIS — Z0000 Encounter for general adult medical examination without abnormal findings: Secondary | ICD-10-CM | POA: Diagnosis not present

## 2017-01-03 DIAGNOSIS — Z114 Encounter for screening for human immunodeficiency virus [HIV]: Secondary | ICD-10-CM | POA: Diagnosis not present

## 2017-01-03 DIAGNOSIS — E119 Type 2 diabetes mellitus without complications: Secondary | ICD-10-CM

## 2017-01-03 DIAGNOSIS — Z23 Encounter for immunization: Secondary | ICD-10-CM

## 2017-01-03 DIAGNOSIS — I10 Essential (primary) hypertension: Secondary | ICD-10-CM

## 2017-01-03 DIAGNOSIS — Z1239 Encounter for other screening for malignant neoplasm of breast: Secondary | ICD-10-CM

## 2017-01-03 NOTE — Progress Notes (Signed)
Patient: Carolyn Nolan, Female    DOB: 11/10/1971, 46 y.o.   MRN: [id number] Visit Date: 01/03/2017  Today's Provider: Margaretann Loveless, PA-C   Chief Complaint  Patient presents with  . Annual Exam   Subjective:    Annual physical exam Carolyn Nolan is a 46 y.o. female who presents today for health maintenance and complete physical. She feels fairly well, she is getting over a cold. She reports exercising. She reports she is sleeping well.  CPE-12/2015 Pap-12/27/13 Normal-HPV negative Mammogram-01/05/16 BI-RADS 1 Colon-08/15/2007-Hemorrhoids She had her Influenza vaccine at work [facility] 09/07/16 -----------------------------------------------------------------   Review of Systems  Constitutional: Negative.   HENT: Positive for congestion, rhinorrhea, sneezing and sore throat.   Eyes: Negative.   Respiratory: Positive for cough.   Cardiovascular: Negative.   Gastrointestinal: Negative.   Endocrine: Negative.   Genitourinary: Negative.   Musculoskeletal: Negative.   Skin: Negative.   Allergic/Immunologic: Positive for environmental allergies.  Neurological: Negative.   Hematological: Negative.   Psychiatric/Behavioral: Negative.     Social History      She  reports that she has never smoked. She has never used smokeless tobacco. She reports that she drinks alcohol. She reports that she does not use drugs.       Social History   Social History  . Marital status: Single    Spouse name: N/A  . Number of children: N/A  . Years of education: N/A   Social History Main Topics  . Smoking status: Never Smoker  . Smokeless tobacco: Never Used  . Alcohol use Yes     Comment: Occasional Wine  . Drug use: No  . Sexual activity: Not Asked   Other Topics Concern  . None   Social History Narrative  . None    History reviewed. No pertinent past medical history.   Patient Active Problem List   Diagnosis Date Noted  . Diabetes mellitus without  complication (HCC) 01/02/2016  . Costalchondritis 10/06/2015  . Allergic rhinitis 04/29/2015  . Adult BMI 30+ 04/29/2015  . Deep vein thrombosis (HCC) 04/29/2015  . Burning or prickling sensation 04/29/2015  . Anemia, iron deficiency 06/24/2009  . Diabetes mellitus, type 2 (HCC) 08/19/2000  . BP (high blood pressure) 02/07/1996    Past Surgical History:  Procedure Laterality Date  . NO PAST SURGERIES      Family History        Family Status  Relation Status  . Mother Alive  . Father Deceased  . Sister Alive        Her family history includes Diabetes in her father and mother; Healthy in her sister; Hypertension in her father.     No Known Allergies   Current Outpatient Prescriptions:  .  aspirin 81 MG tablet, BAYER LOW STRENGTH, 81MG  (Oral Tablet Delayed Release)  1 Every Day for 0 days  Quantity: 0.00;  Refills: 0   Ordered :05-Oct-2010  Amie Critchley ;  Started 09-May-2007 Active Comments: DX: 250.00, Disp: , Rfl:  .  Calcium Carbonate-Vitamin D 600-200 MG-UNIT CAPS, CALCIUM + D, 600-200MG -UNIT (Oral Tablet)  1 Twice Daily for 0 days  Quantity: 0.00;  Refills: 0   Ordered :05-Oct-2010  Kavin Leech ;  Started 03-July-2010 Active, Disp: , Rfl:  .  Cetirizine HCl 10 MG CAPS, Take by mouth., Disp: , Rfl:  .  cyclobenzaprine (FLEXERIL) 5 MG tablet, take 1 tablet by mouth at bedtime if needed for MUSCLE SPASMS, Disp: 30 tablet, Rfl:  5 .  fluticasone (FLONASE) 50 MCG/ACT nasal spray, FLONASE, 50MCG/ACT (Nasal Suspension)  two sprays each nostril, as needed for 0 days  Quantity: 0.00;  Refills: 0   Ordered :05-Oct-2010  Amie Critchley ;  Started 09-May-2007 Active Comments: Medication taken as needed. DX: 477.9, Disp: , Rfl:  .  IRON-VITAMINS PO, Take by mouth., Disp: , Rfl:  .  JANUVIA 100 MG tablet, take 1 tablet by mouth once daily, Disp: 30 tablet, Rfl: 5 .  meloxicam (MOBIC) 15 MG tablet, take 1 tablet by mouth once daily if needed, Disp: 30 tablet, Rfl: 11 .   metFORMIN (GLUCOPHAGE) 1000 MG tablet, take 1 tablet by mouth twice a day with food, Disp: 60 tablet, Rfl: 5 .  montelukast (SINGULAIR) 10 MG tablet, take 1 tablet by mouth at bedtime, Disp: 90 tablet, Rfl: 1 .  MULTIPLE VITAMIN PO, MULTIVITAMINS (Oral Tablet)  1 Every Day for 0 days  Quantity: 0.00;  Refills: 0   Ordered :05-Oct-2010  Kavin Leech ;  Started 18-Mar-2010 Active Comments: DX: 250.00, Disp: , Rfl:  .  Potassium 99 MG TABS, Take by mouth., Disp: , Rfl:  .  ramipril (ALTACE) 2.5 MG capsule, take 1 capsule by mouth once daily, Disp: 90 capsule, Rfl: 1 .  triamterene-hydrochlorothiazide (MAXZIDE-25) 37.5-25 MG tablet, take 1 tablet by mouth once daily, Disp: 90 tablet, Rfl: 1   Patient Care Team: Margaretann Loveless, PA-C as PCP - General (Family Medicine)      Objective:   Vitals: BP 130/86 (BP Location: Right Arm, Patient Position: Sitting, Cuff Size: Large)   Pulse 68   Temp 97.6 F (36.4 C) (Oral)   Resp 16   Wt 270 lb (122.5 kg)   LMP 12/11/2016   BMI 46.35 kg/m    Physical Exam  Constitutional: She is oriented to person, place, and time. She appears well-developed and well-nourished. No distress.  Obese  HENT:  Head: Normocephalic and atraumatic.  Right Ear: Hearing, tympanic membrane, external ear and ear canal normal.  Left Ear: Hearing, tympanic membrane, external ear and ear canal normal.  Nose: Nose normal.  Mouth/Throat: Uvula is midline, oropharynx is clear and moist and mucous membranes are normal. No oropharyngeal exudate.  Eyes: Conjunctivae and EOM are normal. Pupils are equal, round, and reactive to light. Right eye exhibits no discharge. Left eye exhibits no discharge. No scleral icterus.  Neck: Normal range of motion. Neck supple. No JVD present. Carotid bruit is not present. No tracheal deviation present. No thyromegaly present.  Cardiovascular: Normal rate, regular rhythm, normal heart sounds and intact distal pulses.  Exam reveals no gallop and no  friction rub.   No murmur heard. Pulmonary/Chest: Effort normal and breath sounds normal. No respiratory distress. She has no wheezes. She has no rales. She exhibits no tenderness. Right breast exhibits no inverted nipple, no mass, no nipple discharge, no skin change and no tenderness. Left breast exhibits no inverted nipple, no mass, no nipple discharge, no skin change and no tenderness. Breasts are symmetrical.  Abdominal: Soft. Bowel sounds are normal. She exhibits no distension and no mass. There is no tenderness. There is no rebound and no guarding. Hernia confirmed negative in the right inguinal area and confirmed negative in the left inguinal area.  Genitourinary: Rectum normal, vagina normal and uterus normal. No breast swelling, tenderness, discharge or bleeding. Pelvic exam was performed with patient supine. There is no rash, tenderness, lesion or injury on the right labia. There is no rash, tenderness, lesion  or injury on the left labia. Cervix exhibits no motion tenderness, no discharge and no friability. Right adnexum displays no mass, no tenderness and no fullness. Left adnexum displays no mass, no tenderness and no fullness. No erythema, tenderness or bleeding in the vagina. No signs of injury around the vagina. No vaginal discharge found.  Musculoskeletal: Normal range of motion. She exhibits no edema or tenderness.  Lymphadenopathy:    She has no cervical adenopathy.       Right: No inguinal adenopathy present.       Left: No inguinal adenopathy present.  Neurological: She is alert and oriented to person, place, and time. She has normal reflexes. No cranial nerve deficit. Coordination normal.  Skin: Skin is warm and dry. No rash noted. She is not diaphoretic.  Psychiatric: She has a normal mood and affect. Her behavior is normal. Judgment and thought content normal.  Vitals reviewed.    Depression Screen PHQ 2/9 Scores 01/02/2016  PHQ - 2 Score 1      Assessment & Plan:      Routine Health Maintenance and Physical Exam  Exercise Activities and Dietary recommendations Goals    . Exercise 150 minutes per week (moderate activity)       Immunization History  Administered Date(s) Administered  . Influenza-Unspecified 10/14/2015  . Pneumococcal Polysaccharide-23 10/15/2011  . Tdap 03/10/2006    Health Maintenance  Topic Date Due  . HIV Screening  02/24/1985  . TETANUS/TDAP  03/10/2016  . INFLUENZA VACCINE  07/13/2016  . PNEUMOCOCCAL POLYSACCHARIDE VACCINE (2) 10/14/2016  . PAP SMEAR  12/27/2016  . FOOT EXAM  01/01/2017  . HEMOGLOBIN A1C  02/02/2017  . OPHTHALMOLOGY EXAM  10/04/2017     Discussed health benefits of physical activity, and encouraged her to engage in regular exercise appropriate for her age and condition.    1. Annual physical exam Normal physical exam today. Will check labs as below and f/u pending lab results. If labs are stable and WNL she will not need to have these rechecked for one year at her next annual physical exam. She is to call the office in the meantime if she has any acute issue, questions or concerns. - CBC with Differential/Platelet - Comprehensive metabolic panel - TSH  2. Screening for breast cancer Breast exam today was normal. There is no family history of breast cancer. She does perform regular self breast exams. Mammogram was ordered as below. Information for [facility] was given to patient so she may schedule her mammogram at her convenience. - MM DIGITAL SCREENING BILATERAL; Future  3. Screening for cervical cancer Pap collected today. Will send as below and f/u pending results. - Pap IG and HPV (high risk) DNA detection  4. Encounter for screening for HIV - HIV antibody (with reflex)  5. Essential hypertension Stable. Continue maxzide 37.5-25mg , ramipril 2.5mg . Will check labs as below and f/u pending results. I will see her back in 3 months for f/u HTN and T2DM. - CBC with  Differential/Platelet - Comprehensive metabolic panel - TSH  6. Diabetes mellitus without complication (HCC) Stable. Continue metformin 1000mg  BID, januvia 100mg . Will check labs as below and f/u pending results. I will see her back in 3 months. - Hemoglobin A1c - Lipid panel  7. Need for tetanus booster Td booster vaccine given to patient without complications. Patient sat for 15 minutes after administration and was tolerated well without adverse effects. - Td : Tetanus/diphtheria >7yo Preservative  free  --------------------------------------------------------------------  Mar Daring, PA-C  [facility]

## 2017-01-03 NOTE — Patient Instructions (Signed)

## 2017-01-04 ENCOUNTER — Telehealth: Payer: Self-pay

## 2017-01-04 LAB — CBC WITH DIFFERENTIAL/PLATELET
BASOS: 0 %
Basophils Absolute: 0 10*3/uL (ref 0.0–0.2)
EOS (ABSOLUTE): 0 10*3/uL (ref 0.0–0.4)
Eos: 0 %
Hematocrit: 36.5 % (ref 34.0–46.6)
Hemoglobin: 11.5 g/dL (ref 11.1–15.9)
IMMATURE GRANS (ABS): 0 10*3/uL (ref 0.0–0.1)
IMMATURE GRANULOCYTES: 0 %
LYMPHS: 21 %
Lymphocytes Absolute: 2.3 10*3/uL (ref 0.7–3.1)
MCH: 28.1 pg (ref 26.6–33.0)
MCHC: 31.5 g/dL (ref 31.5–35.7)
MCV: 89 fL (ref 79–97)
Monocytes Absolute: 0.9 10*3/uL (ref 0.1–0.9)
Monocytes: 9 %
NEUTROS PCT: 70 %
Neutrophils Absolute: 7.5 10*3/uL — ABNORMAL HIGH (ref 1.4–7.0)
PLATELETS: 407 10*3/uL — AB (ref 150–379)
RBC: 4.09 x10E6/uL (ref 3.77–5.28)
RDW: 14.7 % (ref 12.3–15.4)
WBC: 10.8 10*3/uL (ref 3.4–10.8)

## 2017-01-04 LAB — HIV ANTIBODY (ROUTINE TESTING W REFLEX): HIV SCREEN 4TH GENERATION: NONREACTIVE

## 2017-01-04 LAB — COMPREHENSIVE METABOLIC PANEL
ALT: 39 IU/L — AB (ref 0–32)
AST: 29 IU/L (ref 0–40)
Albumin/Globulin Ratio: 1.3 (ref 1.2–2.2)
Albumin: 4.3 g/dL (ref 3.5–5.5)
Alkaline Phosphatase: 64 IU/L (ref 39–117)
BUN/Creatinine Ratio: 10 (ref 9–23)
BUN: 8 mg/dL (ref 6–24)
Bilirubin Total: 0.2 mg/dL (ref 0.0–1.2)
CALCIUM: 9.6 mg/dL (ref 8.7–10.2)
CO2: 24 mmol/L (ref 18–29)
CREATININE: 0.77 mg/dL (ref 0.57–1.00)
Chloride: 97 mmol/L (ref 96–106)
GFR, EST AFRICAN AMERICAN: 107 mL/min/{1.73_m2} (ref >59)
GFR, EST NON AFRICAN AMERICAN: 93 mL/min/{1.73_m2} (ref >59)
GLUCOSE: 164 mg/dL — AB (ref 65–99)
Globulin, Total: 3.2 g/dL (ref 1.5–4.5)
Potassium: 4.5 mmol/L (ref 3.5–5.2)
Sodium: 139 mmol/L (ref 134–144)
Total Protein: 7.5 g/dL (ref 6.0–8.5)

## 2017-01-04 LAB — LIPID PANEL
CHOLESTEROL TOTAL: 205 mg/dL — AB (ref 100–199)
Chol/HDL Ratio: 2.8 ratio units (ref 0.0–4.4)
HDL: 74 mg/dL (ref >39)
LDL CALC: 114 mg/dL — AB (ref 0–99)
TRIGLYCERIDES: 87 mg/dL (ref 0–149)
VLDL CHOLESTEROL CAL: 17 mg/dL (ref 5–40)

## 2017-01-04 LAB — HEMOGLOBIN A1C
ESTIMATED AVERAGE GLUCOSE: 128 mg/dL
Hgb A1c MFr Bld: 6.1 % — ABNORMAL HIGH (ref 4.8–5.6)

## 2017-01-04 LAB — TSH: TSH: 2.72 u[IU]/mL (ref 0.450–4.500)

## 2017-01-04 NOTE — Telephone Encounter (Signed)
Patient advised as below. Patient verbalizes understanding and is in agreement with treatment plan.  

## 2017-01-04 NOTE — Telephone Encounter (Signed)
-----   Message from Margaretann LovelessJennifer M Burnette, [place]-C sent at 01/04/2017  8:25 AM EST ----- Cholesterol is up from last year and HgBA1c has increased from 5.6 in 07/2016 to 6.1, which is pre-diabetic. Really focus on eating a healthy diet, limiting carbohydrates and sugars. Try to start adding physical activity and work up to get in 150 min moderate activity per week. We should recheck A1c and lipids in 6 months. All other labs are stable and WNL.

## 2017-01-07 LAB — PAP IG AND HPV HIGH-RISK: PAP Smear Comment: 0

## 2017-01-07 LAB — HPV, LOW VOLUME (REFLEX): HPV low volume reflex: NEGATIVE

## 2017-01-27 ENCOUNTER — Encounter: Payer: Self-pay | Admitting: Physician Assistant

## 2017-01-27 ENCOUNTER — Ambulatory Visit (INDEPENDENT_AMBULATORY_CARE_PROVIDER_SITE_OTHER): Payer: Managed Care, Other (non HMO) | Admitting: Physician Assistant

## 2017-01-27 VITALS — BP 120/70 | HR 100 | Temp 98.4°F | Resp 16 | Ht 64.0 in | Wt 268.0 lb

## 2017-01-27 DIAGNOSIS — Z124 Encounter for screening for malignant neoplasm of cervix: Secondary | ICD-10-CM

## 2017-01-27 NOTE — Patient Instructions (Signed)

## 2017-01-27 NOTE — Progress Notes (Signed)
Patient: Carolyn MeiersStacy G Nolan Female    DOB: 06-Aug-1971   46 y.o.   MRN: [id number] Visit Date: 01/27/2017  Today's Provider: Margaretann LovelessJennifer M Jerad Dunlap, PA-C   Chief Complaint  Patient presents with  . Follow-up    recheck pap   Subjective:    HPI Patient was here for annual physical exam on 01/03/17 and had pap smear that returned with "thick cellularity". She is here today for repeat pap. No complaints.    No Known Allergies   Current Outpatient Prescriptions:  .  aspirin 81 MG tablet, BAYER LOW STRENGTH, 81MG  (Oral Tablet Delayed Release)  1 Every Day for 0 days  Quantity: 0.00;  Refills: 0   Ordered :05-Oct-2010  Amie CritchleyMitchell, Stephanie ;  Started 09-May-2007 Active Comments: DX: 250.00, Disp: , Rfl:  .  Calcium Carbonate-Vitamin D 600-200 MG-UNIT CAPS, CALCIUM + D, 600-200MG -UNIT (Oral Tablet)  1 Twice Daily for 0 days  Quantity: 0.00;  Refills: 0   Ordered :05-Oct-2010  Kavin LeechWalsh, Laura ;  Started 03-July-2010 Active, Disp: , Rfl:  .  Cetirizine HCl 10 MG CAPS, Take by mouth., Disp: , Rfl:  .  cyclobenzaprine (FLEXERIL) 5 MG tablet, take 1 tablet by mouth at bedtime if needed for MUSCLE SPASMS, Disp: 30 tablet, Rfl: 5 .  fluticasone (FLONASE) 50 MCG/ACT nasal spray, FLONASE, 50MCG/ACT (Nasal Suspension)  two sprays each nostril, as needed for 0 days  Quantity: 0.00;  Refills: 0   Ordered :05-Oct-2010  Amie CritchleyMitchell, Stephanie ;  Started 09-May-2007 Active Comments: Medication taken as needed. DX: 477.9, Disp: , Rfl:  .  IRON-VITAMINS PO, Take by mouth., Disp: , Rfl:  .  JANUVIA 100 MG tablet, take 1 tablet by mouth once daily, Disp: 30 tablet, Rfl: 5 .  meloxicam (MOBIC) 15 MG tablet, take 1 tablet by mouth once daily if needed, Disp: 30 tablet, Rfl: 11 .  metFORMIN (GLUCOPHAGE) 1000 MG tablet, take 1 tablet by mouth twice a day with food, Disp: 60 tablet, Rfl: 5 .  montelukast (SINGULAIR) 10 MG tablet, take 1 tablet by mouth at bedtime, Disp: 90 tablet, Rfl: 1 .  MULTIPLE VITAMIN PO, MULTIVITAMINS  (Oral Tablet)  1 Every Day for 0 days  Quantity: 0.00;  Refills: 0   Ordered :05-Oct-2010  Kavin LeechWalsh, Laura ;  Started 18-Mar-2010 Active Comments: DX: 250.00, Disp: , Rfl:  .  Potassium 99 MG TABS, Take by mouth., Disp: , Rfl:  .  ramipril (ALTACE) 2.5 MG capsule, take 1 capsule by mouth once daily, Disp: 90 capsule, Rfl: 1 .  triamterene-hydrochlorothiazide (MAXZIDE-25) 37.5-25 MG tablet, take 1 tablet by mouth once daily, Disp: 90 tablet, Rfl: 1  Review of Systems  Constitutional: Negative.   HENT: Negative.   Respiratory: Positive for cough.   Cardiovascular: Negative.   Gastrointestinal: Negative.   Genitourinary: Negative.     Social History  Substance Use Topics  . Smoking status: Never Smoker  . Smokeless tobacco: Never Used  . Alcohol use Yes     Comment: Occasional Wine   Objective:   Wt 268 lb (121.6 kg)   BMI 46.00 kg/m   Physical Exam  Constitutional: She appears well-developed and well-nourished.  Genitourinary: Vagina normal and uterus normal. There is no rash, tenderness, lesion or injury on the right labia. There is no rash, tenderness, lesion or injury on the left labia. Cervix exhibits no motion tenderness, no discharge and no friability. Right adnexum displays no mass, no tenderness and no fullness. Left adnexum displays no  mass, no tenderness and no fullness. No erythema in the vagina. No vaginal discharge found.  Vitals reviewed.      Assessment & Plan:     1. Cervical cancer screening Pap collected today. Will send as below and f/u pending results. - Pap IG and HPV (high risk) DNA detection       Margaretann Loveless, PA-C  [facility]

## 2017-01-28 ENCOUNTER — Ambulatory Visit
Admission: RE | Admit: 2017-01-28 | Discharge: 2017-01-28 | Disposition: A | Discharge status: Home / Self Care | Payer: Managed Care, Other (non HMO) | Source: Ambulatory Visit | Attending: Physician Assistant | Admitting: Physician Assistant

## 2017-01-28 ENCOUNTER — Telehealth: Payer: Self-pay

## 2017-01-28 DIAGNOSIS — Z1239 Encounter for other screening for malignant neoplasm of breast: Secondary | ICD-10-CM

## 2017-01-28 DIAGNOSIS — Z1231 Encounter for screening mammogram for malignant neoplasm of breast: Secondary | ICD-10-CM | POA: Diagnosis present

## 2017-01-28 NOTE — Telephone Encounter (Signed)
[id number]-KW 

## 2017-01-28 NOTE — Telephone Encounter (Signed)
-----   Message from Margaretann LovelessJennifer M Burnette, [place]PA-C sent at 01/28/2017  4:34 PM EST ----- Normal mammogram. Repeat screening in one year.

## 2017-01-31 LAB — PAP IG AND HPV HIGH-RISK
HPV, HIGH-RISK: NEGATIVE
PAP Smear Comment: 0

## 2017-01-31 NOTE — Telephone Encounter (Signed)
Person that answer phone stated"She is at work([employer])"-patient has mychart active. Closing encounter.  Thanks,  -Alleta Avery

## 2017-02-01 ENCOUNTER — Telehealth: Payer: Self-pay

## 2017-02-01 NOTE — Telephone Encounter (Signed)
LMTCB  Thanks,  -Joseline 

## 2017-02-01 NOTE — Progress Notes (Signed)
Advised  ED 

## 2017-02-01 NOTE — Telephone Encounter (Signed)
-----   Message from Margaretann LovelessJennifer M Burnette, [place]PA-C sent at 01/31/2017  4:53 PM EST ----- Pap is normal, HPV negative.  Will repeat in 3-5 years.

## 2017-02-02 ENCOUNTER — Other Ambulatory Visit: Payer: Self-pay | Admitting: Physician Assistant

## 2017-02-02 DIAGNOSIS — E119 Type 2 diabetes mellitus without complications: Secondary | ICD-10-CM

## 2017-02-25 ENCOUNTER — Other Ambulatory Visit: Payer: Self-pay | Admitting: Physician Assistant

## 2017-02-25 DIAGNOSIS — I10 Essential (primary) hypertension: Secondary | ICD-10-CM

## 2017-03-02 ENCOUNTER — Other Ambulatory Visit: Payer: Self-pay

## 2017-03-02 MED ORDER — CYCLOBENZAPRINE HCL 5 MG PO TABS: ORAL_TABLET | ORAL | 5 refills | Status: DC

## 2017-03-02 NOTE — Telephone Encounter (Signed)
Pharmacy requesting refill Last ov 01/27/17  Last filled 10/06/15 Please review. Thank you. sd

## 2017-03-25 ENCOUNTER — Other Ambulatory Visit: Payer: Self-pay | Admitting: Physician Assistant

## 2017-03-25 DIAGNOSIS — I10 Essential (primary) hypertension: Secondary | ICD-10-CM

## 2017-04-04 ENCOUNTER — Encounter: Payer: Self-pay | Admitting: Physician Assistant

## 2017-04-04 ENCOUNTER — Ambulatory Visit (INDEPENDENT_AMBULATORY_CARE_PROVIDER_SITE_OTHER): Payer: Managed Care, Other (non HMO) | Admitting: Physician Assistant

## 2017-04-04 VITALS — BP 128/70 | HR 94 | Temp 98.0°F | Resp 16 | Wt 273.4 lb

## 2017-04-04 DIAGNOSIS — E119 Type 2 diabetes mellitus without complications: Secondary | ICD-10-CM

## 2017-04-04 DIAGNOSIS — I10 Essential (primary) hypertension: Secondary | ICD-10-CM

## 2017-04-04 LAB — POCT GLYCOSYLATED HEMOGLOBIN (HGB A1C)
ESTIMATED AVERAGE GLUCOSE: 160
Hemoglobin A1C: 7.2

## 2017-04-04 NOTE — Progress Notes (Signed)
Patient: Carolyn Nolan Female    DOB: January 11, 1971   46 y.o.   MRN: [id number] Visit Date: 04/04/2017  Today's Provider: Margaretann Loveless, PA-C   Chief Complaint  Patient presents with  . Follow-up    Diabetes and Hypertension   Subjective:    HPI    Diabetes Mellitus Type II, Follow-up:   Lab Results  Component Value Date   HGBA1C 7.2 04/04/2017   HGBA1C 6.1 (H) 01/03/2017   HGBA1C 5.6 08/02/2016    Last seen for diabetes 3 months ago.  Management since then includes none.Continue metformin  BID, januvia . She reports excellent compliance with treatment. She is not having side effects.  Current symptoms include none and have been stable. Home blood sugar records: not checking  Episodes of hypoglycemia? no   Most Recent Eye Exam: 09/2016 Weight trend: stable Prior visit with dietician: no Current diet: "in between" Current exercise: none  Pertinent Labs:    Component Value Date/Time   CHOL 205 (H) 01/03/2017 1030   TRIG 87 01/03/2017 1030   HDL 74 01/03/2017 1030   LDLCALC 114 (H) 01/03/2017 1030   CREATININE 0.77 01/03/2017 1030    Wt Readings from Last 3 Encounters:  04/04/17 273 lb 6.4 oz (124 kg)  01/27/17 268 lb (121.6 kg)  01/03/17 270 lb (122.5 kg)    ------------------------------------------------------------------------  Hypertension, follow-up:  BP Readings from Last 3 Encounters:  04/04/17 128/70  01/27/17 120/70  01/03/17 130/86    She was last seen for hypertension 3 months ago.  BP at that visit was 130/86. Management since that visit includes continue Maxzide 37.5-25 MG and Ramipril 2.5 MG  She reports excellent compliance with treatment. She is not having side effects.  She is adherent to low salt diet.   Outside blood pressures are n/a. She is experiencing none.  Patient denies chest pain, chest pressure/discomfort, exertional chest pressure/discomfort, fatigue, irregular heart beat, lower extremity edema,  near-syncope and palpitations.   Cardiovascular risk factors include diabetes mellitus, hypertension and obesity (BMI >= 30 kg/m2).   ------------------------------------------------------------------------     No Known Allergies   Current Outpatient Prescriptions:  .  aspirin 81 MG tablet, BAYER LOW STRENGTH,  (Oral Tablet Delayed Release)  1 Every Day for 0 days  Quantity: 0.00;  Refills: 0   Ordered :05-Oct-2010  Amie Critchley ;  Started 09-May-2007 Active Comments: DX: 250.00, Disp: , Rfl:  .  Calcium Carbonate-Vitamin D 600-200 MG-UNIT CAPS, CALCIUM + D, 600-200MG -UNIT (Oral Tablet)  1 Twice Daily for 0 days  Quantity: 0.00;  Refills: 0   Ordered :05-Oct-2010  Kavin Leech ;  Started 03-July-2010 Active, Disp: , Rfl:  .  Cetirizine HCl 10 MG CAPS, Take by mouth., Disp: , Rfl:  .  cyclobenzaprine (FLEXERIL) 5 MG tablet, take 1 tablet by mouth at bedtime if needed for MUSCLE SPASMS, Disp: 30 tablet, Rfl: 5 .  fluticasone (FLONASE) 50 MCG/ACT nasal spray, FLONASE, 50MCG/ACT (Nasal Suspension)  two sprays each nostril, as needed for 0 days  Quantity: 0.00;  Refills: 0   Ordered :05-Oct-2010  Amie Critchley ;  Started 09-May-2007 Active Comments: Medication taken as needed. DX: 477.9, Disp: , Rfl:  .  IRON-VITAMINS PO, Take by mouth., Disp: , Rfl:  .  JANUVIA 100 MG tablet, take 1 tablet by mouth once daily, Disp: 30 tablet, Rfl: 5 .  meloxicam (MOBIC) 15 MG tablet, take 1 tablet by mouth once daily if needed, Disp: 30 tablet, Rfl:  11 .  metFORMIN (GLUCOPHAGE) 1000 MG tablet, take 1 tablet by mouth twice a day with food, Disp: 60 tablet, Rfl: 5 .  montelukast (SINGULAIR) 10 MG tablet, take 1 tablet by mouth at bedtime, Disp: 90 tablet, Rfl: 1 .  MULTIPLE VITAMIN PO, MULTIVITAMINS (Oral Tablet)  1 Every Day for 0 days  Quantity: 0.00;  Refills: 0   Ordered :05-Oct-2010  Kavin Leech ;  Started 18-Mar-2010 Active Comments: DX: 250.00, Disp: , Rfl:  .  Potassium 99 MG TABS, Take by  mouth., Disp: , Rfl:  .  ramipril (ALTACE) 2.5 MG capsule, take 1 capsule by mouth once daily, Disp: 90 capsule, Rfl: 1 .  triamterene-hydrochlorothiazide (MAXZIDE-25) 37.5-25 MG tablet, take 1 tablet by mouth once daily, Disp: 90 tablet, Rfl: 1  Review of Systems  Constitutional: Negative.   Respiratory: Negative.   Cardiovascular: Negative.   Gastrointestinal: Negative.   Neurological: Negative.     Social History  Substance Use Topics  . Smoking status: Never Smoker  . Smokeless tobacco: Never Used  . Alcohol use Yes     Comment: Occasional Wine   Objective:   BP 128/70 (BP Location: Right Arm, Patient Position: Sitting, Cuff Size: Large)   Pulse 94   Temp 98 F (36.7 C) (Oral)   Resp 16   Wt 273 lb 6.4 oz (124 kg)   LMP 02/13/2016   BMI 46.93 kg/m     Physical Exam  Constitutional: She appears well-developed and well-nourished. No distress.  Neck: Normal range of motion. Neck supple. No JVD present. No tracheal deviation present. No thyromegaly present.  Cardiovascular: Normal rate, regular rhythm and normal heart sounds.  Exam reveals no gallop and no friction rub.   No murmur heard. Pulmonary/Chest: Effort normal and breath sounds normal. No respiratory distress. She has no wheezes. She has no rales.  Musculoskeletal: She exhibits no edema.  Lymphadenopathy:    She has no cervical adenopathy.  Skin: She is not diaphoretic.  Vitals reviewed.  Diabetic Foot Exam - Simple   Simple Foot Form Diabetic Foot exam was performed with the following findings:  Yes 04/04/2017  4:30 PM  Visual Inspection No deformities, no ulcerations, no other skin breakdown bilaterally:  Yes Sensation Testing Intact to touch and monofilament testing bilaterally:  Yes Pulse Check Posterior Tibialis and Dorsalis pulse intact bilaterally:  Yes Comments Normal exam       Assessment & Plan:     1. Type 2 diabetes mellitus without complication, without long-term current use of insulin  (HCC) A1c increased to 7.2. Patient reports not adhering to healthy diet and eating more carbs. She is going to start focusing on dieting habits again and start walking again. Continue metformin and janiva. I will see her back in 3 months to recheck. If A1c lowers will go to a 6 month f/u. - POCT glycosylated hemoglobin (Hb A1C)  2. Essential hypertension Stable. Continue Maxzide 37.5-25mg  and Ramipril 2.5mg .      Margaretann Loveless, PA-C  [facility]

## 2017-04-04 NOTE — Patient Instructions (Signed)
Carbohydrate Counting for Diabetes Mellitus, Adult Carbohydrate counting is a method for keeping track of how many carbohydrates you eat. Eating carbohydrates naturally increases the amount of sugar (glucose) in the blood. Counting how many carbohydrates you eat helps keep your blood glucose within normal limits, which helps you manage your diabetes (diabetes mellitus). It is important to know how many carbohydrates you can safely have in each meal. This is different for every person. A diet and nutrition specialist (registered dietitian) can help you make a meal plan and calculate how many carbohydrates you should have at each meal and snack. Carbohydrates are found in the following foods:  Grains, such as breads and cereals.  Dried beans and soy products.  Starchy vegetables, such as potatoes, peas, and corn.  Fruit and fruit juices.  Milk and yogurt.  Sweets and snack foods, such as cake, cookies, candy, chips, and soft drinks. How do I count carbohydrates? There are two ways to count carbohydrates in food. You can use either of the methods or a combination of both. Reading "Nutrition Facts" on packaged food  The "Nutrition Facts" list is included on the labels of almost all packaged foods and beverages in the [place] It includes:  The serving size.  Information about nutrients in each serving, including the grams (g) of carbohydrate per serving. To use the "Nutrition Facts":  Decide how many servings you will have.  Multiply the number of servings by the number of carbohydrates per serving.  The resulting number is the total amount of carbohydrates that you will be having. Learning standard serving sizes of other foods  When you eat foods containing carbohydrates that are not packaged or do not include "Nutrition Facts" on the label, you need to measure the servings in order to count the amount of carbohydrates:  Measure the foods that you will eat with a food scale or measuring  cup, if needed.  Decide how many standard-size servings you will eat.  Multiply the number of servings by 15. Most carbohydrate-rich foods have about 15 g of carbohydrates per serving.  For example, if you eat 8 oz (170 g) of strawberries, you will have eaten 2 servings and 30 g of carbohydrates (2 servings x 15 g = 30 g).  For foods that have more than one food mixed, such as soups and casseroles, you must count the carbohydrates in each food that is included. The following list contains standard serving sizes of common carbohydrate-rich foods. Each of these servings has about 15 g of carbohydrates:   hamburger bun or  English muffin.   oz (15 mL) syrup.   oz (14 g) jelly.  1 slice of bread.  1 six-inch tortilla.  3 oz (85 g) cooked rice or pasta.  4 oz (113 g) cooked dried beans.  4 oz (113 g) starchy vegetable, such as peas, corn, or potatoes.  4 oz (113 g) hot cereal.  4 oz (113 g) mashed potatoes or  of a large baked potato.  4 oz (113 g) canned or frozen fruit.  4 oz (120 mL) fruit juice.  4-6 crackers.  6 chicken nuggets.  6 oz (170 g) unsweetened dry cereal.  6 oz (170 g) plain fat-free yogurt or yogurt sweetened with artificial sweeteners.  8 oz (240 mL) milk.  8 oz (170 g) fresh fruit or one small piece of fruit.  24 oz (680 g) popped popcorn. Example of carbohydrate counting Sample meal  3 oz (85 g) chicken breast.  6 oz (  170 g) brown rice.  4 oz (113 g) corn.  8 oz (240 mL) milk.  8 oz (170 g) strawberries with sugar-free whipped topping. Carbohydrate calculation 1. Identify the foods that contain carbohydrates:  Rice.  Corn.  Milk.  Strawberries. 2. Calculate how many servings you have of each food:  2 servings rice.  1 serving corn.  1 serving milk.  1 serving strawberries. 3. Multiply each number of servings by 15 g:  2 servings rice x 15 g = 30 g.  1 serving corn x 15 g = 15 g.  1 serving milk x 15 g = 15  g.  1 serving strawberries x 15 g = 15 g. 4. Add together all of the amounts to find the total grams of carbohydrates eaten:  30 g + 15 g + 15 g + 15 g = 75 g of carbohydrates total. This information is not intended to replace advice given to you by your health care provider. Make sure you discuss any questions you have with your health care provider. Document Released: 11/29/2005 Document Revised: 06/18/2016 Document Reviewed: 05/12/2016 Elsevier Interactive Patient Education  2017 [employer]  

## 2017-05-13 ENCOUNTER — Other Ambulatory Visit: Payer: Self-pay | Admitting: Physician Assistant

## 2017-07-04 ENCOUNTER — Ambulatory Visit (INDEPENDENT_AMBULATORY_CARE_PROVIDER_SITE_OTHER): Payer: 59 | Admitting: Physician Assistant

## 2017-07-04 ENCOUNTER — Encounter: Payer: Self-pay | Admitting: Physician Assistant

## 2017-07-04 VITALS — BP 126/72 | HR 100 | Temp 98.5°F | Resp 16 | Wt 274.0 lb

## 2017-07-04 DIAGNOSIS — B379 Candidiasis, unspecified: Secondary | ICD-10-CM | POA: Diagnosis not present

## 2017-07-04 DIAGNOSIS — I10 Essential (primary) hypertension: Secondary | ICD-10-CM

## 2017-07-04 DIAGNOSIS — E119 Type 2 diabetes mellitus without complications: Secondary | ICD-10-CM | POA: Diagnosis not present

## 2017-07-04 LAB — POCT GLYCOSYLATED HEMOGLOBIN (HGB A1C): Hemoglobin A1C: 8.8

## 2017-07-04 MED ORDER — SEMAGLUTIDE (1 MG/DOSE) 2 MG/1.5ML ~~LOC~~ SOPN: 1.0000 mg | PEN_INJECTOR | SUBCUTANEOUS | 11 refills | Status: DC

## 2017-07-04 NOTE — Progress Notes (Signed)
Patient: Carolyn MeiersStacy G Nolan Female    DOB: July 28, 1971   46 y.o.   MRN: [id number] Visit Date: 07/04/2017  Today's Provider: Margaretann LovelessJennifer M Evanell Redlich, PA-C   Chief Complaint  Patient presents with  . Diabetes  . Rash   Subjective:    HPI  Diabetes Mellitus Type II, Follow-up:   Lab Results  Component Value Date   HGBA1C 8.8 07/04/2017   HGBA1C 7.2 04/04/2017   HGBA1C 6.1 (H) 01/03/2017    Last seen for diabetes 3 months ago.  Management since then includes starting diet changes. She reports good compliance with treatment. She is not having side effects.  Current symptoms include none and have been stable. Home blood sugar records: not being checked.  Episodes of hypoglycemia? no   Most Recent Eye Exam: due Weight trend: stable Prior visit with dietician: no Current diet: well balanced Current exercise: none  Pertinent Labs:    Component Value Date/Time   CHOL 205 (H) 01/03/2017 1030   TRIG 87 01/03/2017 1030   HDL 74 01/03/2017 1030   LDLCALC 114 (H) 01/03/2017 1030   CREATININE 0.77 01/03/2017 1030    Wt Readings from Last 3 Encounters:  07/04/17 274 lb (124.3 kg)  04/04/17 273 lb 6.4 oz (124 kg)  01/27/17 268 lb (121.6 kg)      Hypertension, follow-up:  BP Readings from Last 3 Encounters:  07/04/17 126/72  04/04/17 128/70  01/27/17 120/70    She was last seen for hypertension 3 months ago.  BP at that visit was 128/70. Management since that visit includes no changes. She reports good compliance with treatment. She is not having side effects.  She is not exercising. She is adherent to low salt diet.   Outside blood pressures are not being checked. She is experiencing none.  Patient denies exertional chest pressure/discomfort and palpitations.   Cardiovascular risk factors include diabetes mellitus.     Weight trend: stable Wt Readings from Last 3 Encounters:  07/04/17 274 lb (124.3 kg)  04/04/17 273 lb 6.4 oz (124 kg)  01/27/17 268 lb  (121.6 kg)    Current diet: well balanced  Rash: Patient reports that she has developed a rash under her breasts. She reports that it has been there for at least 1 week. She has not been using anything OTC for her symptoms.      No Known Allergies   Current Outpatient Prescriptions:  .  aspirin 81 MG tablet, BAYER LOW STRENGTH, 81MG  (Oral Tablet Delayed Release)  1 Every Day for 0 days  Quantity: 0.00;  Refills: 0   Ordered :05-Oct-2010  Amie CritchleyMitchell, Stephanie ;  Started 09-May-2007 Active Comments: DX: 250.00, Disp: , Rfl:  .  Calcium Carbonate-Vitamin D 600-200 MG-UNIT CAPS, CALCIUM + D, 600-200MG -UNIT (Oral Tablet)  1 Twice Daily for 0 days  Quantity: 0.00;  Refills: 0   Ordered :05-Oct-2010  Kavin LeechWalsh, Laura ;  Started 03-July-2010 Active, Disp: , Rfl:  .  Cetirizine HCl 10 MG CAPS, Take by mouth., Disp: , Rfl:  .  cyclobenzaprine (FLEXERIL) 5 MG tablet, take 1 tablet by mouth at bedtime if needed for MUSCLE SPASMS, Disp: 30 tablet, Rfl: 5 .  fluticasone (FLONASE) 50 MCG/ACT nasal spray, FLONASE, 50MCG/ACT (Nasal Suspension)  two sprays each nostril, as needed for 0 days  Quantity: 0.00;  Refills: 0   Ordered :05-Oct-2010  Amie CritchleyMitchell, Stephanie ;  Started 09-May-2007 Active Comments: Medication taken as needed. DX: 477.9, Disp: , Rfl:  .  IRON-VITAMINS  PO, Take by mouth., Disp: , Rfl:  .  JANUVIA 100 MG tablet, take 1 tablet by mouth once daily, Disp: 30 tablet, Rfl: 5 .  meloxicam (MOBIC) 15 MG tablet, take 1 tablet by mouth once daily if needed, Disp: 30 tablet, Rfl: 11 .  metFORMIN (GLUCOPHAGE) 1000 MG tablet, take 1 tablet by mouth twice a day with food, Disp: 60 tablet, Rfl: 5 .  montelukast (SINGULAIR) 10 MG tablet, take 1 tablet by mouth at bedtime, Disp: 90 tablet, Rfl: 1 .  MULTIPLE VITAMIN PO, MULTIVITAMINS (Oral Tablet)  1 Every Day for 0 days  Quantity: 0.00;  Refills: 0   Ordered :05-Oct-2010  Kavin Leech ;  Started 18-Mar-2010 Active Comments: DX: 250.00, Disp: , Rfl:  .  Potassium  99 MG TABS, Take by mouth., Disp: , Rfl:  .  ramipril (ALTACE) 2.5 MG capsule, take 1 capsule by mouth once daily, Disp: 90 capsule, Rfl: 1 .  triamterene-hydrochlorothiazide (MAXZIDE-25) 37.5-25 MG tablet, take 1 tablet by mouth once daily, Disp: 90 tablet, Rfl: 1  Review of Systems  Constitutional: Negative.   Respiratory: Negative.   Cardiovascular: Negative.   Endocrine: Negative.   Musculoskeletal: Negative.   Skin: Positive for color change and rash.  Neurological: Negative.     Social History  Substance Use Topics  . Smoking status: Never Smoker  . Smokeless tobacco: Never Used  . Alcohol use Yes     Comment: Occasional Wine   Objective:   BP 126/72 (BP Location: Right Arm, Patient Position: Sitting, Cuff Size: Large)   Pulse 100   Temp 98.5 F (36.9 C)   Resp 16   Wt 274 lb (124.3 kg)   BMI 47.03 kg/m  Vitals:   07/04/17 1616  BP: 126/72  Pulse: 100  Resp: 16  Temp: 98.5 F (36.9 C)  Weight: 274 lb (124.3 kg)     Physical Exam  Constitutional: She appears well-developed and well-nourished. No distress.  Neck: Normal range of motion. Neck supple.  Cardiovascular: Normal rate, regular rhythm and normal heart sounds.  Exam reveals no gallop and no friction rub.   No murmur heard. Pulmonary/Chest: Effort normal and breath sounds normal. No respiratory distress. She has no wheezes. She has no rales.  Skin: Rash noted. She is not diaphoretic.     Vitals reviewed.      Assessment & Plan:     1. Type 2 diabetes mellitus without complication, without long-term current use of insulin (HCC) A1c up to 8.8. Discussed treatment options and patient is willing to start a GLP-1. Instructed on how to use and sample given with first injection given in the office. Continue metformin 1000mg  BID and Januvia 100mg  daily. I will see her back in 3 months to recheck A1c. She is to call the office if she has symptoms.  - POCT glycosylated hemoglobin (Hb A1C) - Semaglutide  (OZEMPIC) 1 MG/DOSE SOPN; Inject 1 mg into the skin once a week.  Dispense: 4 pen; Refill: 11  2. Essential hypertension Stable. Continue Maxzide 37.5-25mg  and ramipril 2.5mg .   3. Yeast infection Yeast infection of the skin under the breast. Nystatin cream given as below. Advised to keep skin clean and dry. - nystatin cream (MYCOSTATIN); Apply 1 application topically 2 (two) times daily.  Dispense: 30 g; Refill: 0       Margaretann Loveless, PA-C  [facility]

## 2017-07-04 NOTE — Patient Instructions (Signed)
Semaglutide injection solution  What is this medicine?  SEMAGLUTIDE (Sem a GLOO tide) is used to improve blood sugar control in adults with type 2 diabetes. This medicine may be used with other diabetes medicines.  This medicine may be used for other purposes; ask your health care provider or pharmacist if you have questions.  COMMON BRAND NAME(S): OZEMPIC  What should I tell my health care provider before I take this medicine?  They need to know if you have any of these conditions:  -endocrine tumors (MEN 2) or if someone in your family had these tumors  -eye disease, vision problems  -history of pancreatitis  -kidney disease  -stomach problems  -thyroid cancer or if someone in your family had thyroid cancer  -an unusual or allergic reaction to semaglutide, other medicines, foods, dyes, or preservatives  -pregnant or trying to get pregnant  -breast-feeding  How should I use this medicine?  This medicine is for injection under the skin of your upper leg (thigh), stomach area, or upper arm. It is given once every week (every 7 days). You will be taught how to prepare and give this medicine. Use exactly as directed. Take your medicine at regular intervals. Do not take it more often than directed.  If you use this medicine with insulin, you should inject this medicine and the insulin separately. Do not mix them together. Do not give the injections right next to each other. Change (rotate) injection sites with each injection.  It is important that you put your used needles and syringes in a special sharps container. Do not put them in a trash can. If you do not have a sharps container, call your pharmacist or healthcare provider to get one.  A special MedGuide will be given to you by the pharmacist with each prescription and refill. Be sure to read this information carefully each time.  Talk to your pediatrician regarding the use of this medicine in children. Special care may be needed.   Overdosage: If you think you have taken too much of this medicine contact a poison control center or emergency room at once.  NOTE: This medicine is only for you. Do not share this medicine with others.  What if I miss a dose?  If you miss a dose, take it as soon as you can within 5 days after the missed dose. Then take your next dose at your regular weekly time. If it has been longer than 5 days after the missed dose, do not take the missed dose. Take the next dose at your regular time. Do not take double or extra doses. If you have questions about a missed dose, contact your health care provider for advice.  What may interact with this medicine?  -other medicines for diabetes  Many medications may cause changes in blood sugar, these include:  -alcohol containing beverages  -antiviral medicines for HIV or AIDS  -aspirin and aspirin-like drugs  -certain medicines for blood pressure, heart disease, irregular heart beat  -chromium  -diuretics  -female hormones, such as estrogens or progestins, birth control pills  -fenofibrate  -gemfibrozil  -isoniazid  -lanreotide  -female hormones or anabolic steroids  -MAOIs like Carbex, Eldepryl, Marplan, Nardil, and Parnate  -medicines for weight loss  -medicines for allergies, asthma, cold, or cough  -medicines for depression, anxiety, or psychotic disturbances  -niacin  -nicotine  -NSAIDs, medicines for pain and inflammation, like ibuprofen or naproxen  -octreotide  -pasireotide  -pentamidine  -phenytoin  -probenecid  -quinolone   antibiotics such as ciprofloxacin, levofloxacin, ofloxacin  -some herbal dietary supplements  -steroid medicines such as prednisone or cortisone  -sulfamethoxazole; trimethoprim  -thyroid hormones  Some medications can hide the warning symptoms of low blood sugar (hypoglycemia). You may need to monitor your blood sugar more closely if you are taking one of these medications. These include:   -beta-blockers, often used for high blood pressure or heart problems (examples include atenolol, metoprolol, propranolol)  -clonidine  -guanethidine  -reserpine  This list may not describe all possible interactions. Give your health care provider a list of all the medicines, herbs, non-prescription drugs, or dietary supplements you use. Also tell them if you smoke, drink alcohol, or use illegal drugs. Some items may interact with your medicine.  What should I watch for while using this medicine?  Visit your doctor or health care professional for regular checks on your progress.  Drink plenty of fluids while taking this medicine. Check with your doctor or health care professional if you get an attack of severe diarrhea, nausea, and vomiting. The loss of too much body fluid can make it dangerous for you to take this medicine.  A test called the HbA1C (A1C) will be monitored. This is a simple blood test. It measures your blood sugar control over the last 2 to 3 months. You will receive this test every 3 to 6 months.  Learn how to check your blood sugar. Learn the symptoms of low and high blood sugar and how to manage them.  Always carry a quick-source of sugar with you in case you have symptoms of low blood sugar. Examples include hard sugar candy or glucose tablets. Make sure others know that you can choke if you eat or drink when you develop serious symptoms of low blood sugar, such as seizures or unconsciousness. They must get medical help at once.  Tell your doctor or health care professional if you have high blood sugar. You might need to change the dose of your medicine. If you are sick or exercising more than usual, you might need to change the dose of your medicine.  Do not skip meals. Ask your doctor or health care professional if you should avoid alcohol. Many nonprescription cough and cold products contain sugar or alcohol. These can affect blood sugar.   Pens should never be shared. Even if the needle is changed, sharing may result in passing of viruses like hepatitis or HIV.  Wear a medical ID bracelet or chain, and carry a card that describes your disease and details of your medicine and dosage times.  What side effects may I notice from receiving this medicine?  Side effects that you should report to your doctor or health care professional as soon as possible:  -allergic reactions like skin rash, itching or hives, swelling of the face, lips, or tongue  -breathing problems  -changes in vision  -diarrhea that continues or is severe  -lump or swelling on the neck  -severe nausea  -signs and symptoms of infection like fever or chills; cough; sore throat; pain or trouble passing urine  -signs and symptoms of low blood sugar such as feeling anxious, confusion, dizziness, increased hunger, unusually weak or tired, sweating, shakiness, cold, irritable, headache, blurred vision, fast heartbeat, loss of consciousness  -signs and symptoms of kidney injury like trouble passing urine or change in the amount of urine  -trouble swallowing  -unusual stomach upset or pain  -vomiting  Side effects that usually do not require medical attention (report   to your doctor or health care professional if they continue or are bothersome):  -constipation  -diarrhea  -nausea  -pain, redness, or irritation at site where injected  -stomach upset  This list may not describe all possible side effects. Call your doctor for medical advice about side effects. You may report side effects to FDA at 1-800-FDA-1088.  Where should I keep my medicine?  Keep out of the reach of children.  Store unopened pens in a refrigerator between 2 and 8 degrees C (36 and 46 degrees F). Do not freeze. Protect from light and heat. After you first use the pen, it can be stored for 56 days at room temperature between 15 and 30 degrees C (59 and 86 degrees F) or in a refrigerator. Throw away  your used pen after 56 days or after the expiration date, whichever comes first.  Do not store your pen with the needle attached. If the needle is left on, medicine may leak from the pen.  NOTE: This sheet is a summary. It may not cover all possible information. If you have questions about this medicine, talk to your doctor, pharmacist, or health care provider.  © 2018 [employer] (2016-12-16 14:43:35)

## 2017-07-05 MED ORDER — NYSTATIN 100000 UNIT/GM EX CREA: 1.0000 "application " | TOPICAL_CREAM | Freq: Two times a day (BID) | CUTANEOUS | 0 refills | Status: DC

## 2017-07-19 ENCOUNTER — Other Ambulatory Visit: Payer: Self-pay | Admitting: Physician Assistant

## 2017-07-19 DIAGNOSIS — E119 Type 2 diabetes mellitus without complications: Secondary | ICD-10-CM

## 2017-08-12 ENCOUNTER — Encounter: Payer: Self-pay | Admitting: Physician Assistant

## 2017-08-12 DIAGNOSIS — E119 Type 2 diabetes mellitus without complications: Secondary | ICD-10-CM

## 2017-08-12 MED ORDER — DULAGLUTIDE 0.75 MG/0.5ML ~~LOC~~ SOAJ: 0.7500 mg | SUBCUTANEOUS | 5 refills | Status: DC

## 2017-08-31 ENCOUNTER — Other Ambulatory Visit: Payer: Self-pay | Admitting: Physician Assistant

## 2017-08-31 DIAGNOSIS — I10 Essential (primary) hypertension: Secondary | ICD-10-CM

## 2017-09-12 ENCOUNTER — Other Ambulatory Visit: Payer: Self-pay | Admitting: Physician Assistant

## 2017-09-23 ENCOUNTER — Other Ambulatory Visit: Payer: Self-pay | Admitting: Physician Assistant

## 2017-09-23 DIAGNOSIS — I10 Essential (primary) hypertension: Secondary | ICD-10-CM

## 2017-10-03 ENCOUNTER — Ambulatory Visit (INDEPENDENT_AMBULATORY_CARE_PROVIDER_SITE_OTHER): Payer: 59 | Admitting: Physician Assistant

## 2017-10-03 ENCOUNTER — Other Ambulatory Visit: Payer: Self-pay | Admitting: Physician Assistant

## 2017-10-03 ENCOUNTER — Encounter: Payer: Self-pay | Admitting: Physician Assistant

## 2017-10-03 VITALS — BP 120/80 | HR 101 | Temp 98.3°F | Resp 16 | Wt 270.0 lb

## 2017-10-03 DIAGNOSIS — E119 Type 2 diabetes mellitus without complications: Secondary | ICD-10-CM

## 2017-10-03 DIAGNOSIS — Z6841 Body Mass Index (BMI) 40.0 and over, adult: Secondary | ICD-10-CM | POA: Diagnosis not present

## 2017-10-03 LAB — POCT GLYCOSYLATED HEMOGLOBIN (HGB A1C)
Est. average glucose Bld gHb Est-mCnc: 151
HEMOGLOBIN A1C: 6.9

## 2017-10-03 MED ORDER — DULAGLUTIDE 0.75 MG/0.5ML ~~LOC~~ SOAJ: 0.7500 mg | SUBCUTANEOUS | 5 refills | Status: DC

## 2017-10-03 NOTE — Patient Instructions (Signed)
Dulaglutide injection What is this medicine? DULAGLUTIDE (DOO la GLOO tide) is used to improve blood sugar control in adults with type 2 diabetes. This medicine may be used with other oral diabetes medicines. This medicine may be used for other purposes; ask your health care provider or pharmacist if you have questions. COMMON BRAND NAME(S): TRULICITY What should I tell my health care provider before I take this medicine? They need to know if you have any of these conditions: -endocrine tumors (MEN 2) or if someone in your family had these tumors -history of pancreatitis -kidney disease -liver disease -stomach problems -thyroid cancer or if someone in your family had thyroid cancer -an unusual or allergic reaction to dulaglutide, other medicines, foods, dyes, or preservatives -pregnant or trying to get pregnant -breast-feeding How should I use this medicine? This medicine is for injection under the skin of your upper leg (thigh), stomach area, or upper arm. It is usually given once every week (every 7 days). You will be taught how to prepare and give this medicine. Use exactly as directed. Take your medicine at regular intervals. Do not take it more often than directed. If you use this medicine with insulin, you should inject this medicine and the insulin separately. Do not mix them together. Do not give the injections right next to each other. Change (rotate) injection sites with each injection. It is important that you put your used needles and syringes in a special sharps container. Do not put them in a trash can. If you do not have a sharps container, call your pharmacist or healthcare provider to get one. A special MedGuide will be given to you by the pharmacist with each prescription and refill. Be sure to read this information carefully each time. Talk to your pediatrician regarding the use of this medicine in children. Special care may be needed. Overdosage: If you think you have taken  too much of this medicine contact a poison control center or emergency room at once. NOTE: This medicine is only for you. Do not share this medicine with others. What if I miss a dose? If you miss a dose, take it as soon as you can within 3 days after the missed dose. Then take your next dose at your regular weekly time. If it has been longer than 3 days after the missed dose, do not take the missed dose. Take the next dose at your regular time. Do not take double or extra doses. If you have questions about a missed dose, contact your health care provider for advice. What may interact with this medicine? -other medicines for diabetes Many medications may cause changes in blood sugar, these include: -alcohol containing beverages -antiviral medicines for HIV or AIDS -aspirin and aspirin-like drugs -certain medicines for blood pressure, heart disease, irregular heart beat -chromium -diuretics -female hormones, such as estrogens or progestins, birth control pills -fenofibrate -gemfibrozil -isoniazid -lanreotide -female hormones or anabolic steroids -MAOIs like Carbex, Eldepryl, Marplan, Nardil, and Parnate -medicines for weight loss -medicines for allergies, asthma, cold, or cough -medicines for depression, anxiety, or psychotic disturbances -niacin -nicotine -NSAIDs, medicines for pain and inflammation, like ibuprofen or naproxen -octreotide -pasireotide -pentamidine -phenytoin -probenecid -quinolone antibiotics such as ciprofloxacin, levofloxacin, ofloxacin -some herbal dietary supplements -steroid medicines such as prednisone or cortisone -sulfamethoxazole; trimethoprim -thyroid hormones Some medications can hide the warning symptoms of low blood sugar (hypoglycemia). You may need to monitor your blood sugar more closely if you are taking one of these medications. These include: -beta-blockers,   often used for high blood pressure or heart problems (examples include atenolol,  metoprolol, propranolol) -clonidine -guanethidine -reserpine This list may not describe all possible interactions. Give your health care provider a list of all the medicines, herbs, non-prescription drugs, or dietary supplements you use. Also tell them if you smoke, drink alcohol, or use illegal drugs. Some items may interact with your medicine. What should I watch for while using this medicine? Visit your doctor or health care professional for regular checks on your progress. Drink plenty of fluids while taking this medicine. Check with your doctor or health care professional if you get an attack of severe diarrhea, nausea, and vomiting. The loss of too much body fluid can make it dangerous for you to take this medicine. A test called the HbA1C (A1C) will be monitored. This is a simple blood test. It measures your blood sugar control over the last 2 to 3 months. You will receive this test every 3 to 6 months. Learn how to check your blood sugar. Learn the symptoms of low and high blood sugar and how to manage them. Always carry a quick-source of sugar with you in case you have symptoms of low blood sugar. Examples include hard sugar candy or glucose tablets. Make sure others know that you can choke if you eat or drink when you develop serious symptoms of low blood sugar, such as seizures or unconsciousness. They must get medical help at once. Tell your doctor or health care professional if you have high blood sugar. You might need to change the dose of your medicine. If you are sick or exercising more than usual, you might need to change the dose of your medicine. Do not skip meals. Ask your doctor or health care professional if you should avoid alcohol. Many nonprescription cough and cold products contain sugar or alcohol. These can affect blood sugar. Pens should never be shared. Even if the needle is changed, sharing may result in passing of viruses like hepatitis or HIV. Wear a medical ID bracelet  or chain, and carry a card that describes your disease and details of your medicine and dosage times. What side effects may I notice from receiving this medicine? Side effects that you should report to your doctor or health care professional as soon as possible: -allergic reactions like skin rash, itching or hives, swelling of the face, lips, or tongue -breathing problems -diarrhea that continues or is severe -lump or swelling on the neck -severe nausea -signs and symptoms of infection like fever or chills; cough; sore throat; pain or trouble passing urine -signs and symptoms of low blood sugar such as feeling anxious, confusion, dizziness, increased hunger, unusually weak or tired, sweating, shakiness, cold, irritable, headache, blurred vision, fast heartbeat, loss of consciousness -signs and symptoms of kidney injury like trouble passing urine or change in the amount of urine -trouble swallowing -unusual stomach upset or pain -vomiting Side effects that usually do not require medical attention (report to your doctor or health care professional if they continue or are bothersome): -diarrhea -loss of appetite -nausea -pain, redness, or irritation at site where injected -stomach upset This list may not describe all possible side effects. Call your doctor for medical advice about side effects. You may report side effects to FDA at 1-800-FDA-1088. Where should I keep my medicine? Keep out of the reach of children. Store unopened pens in a refrigerator between 2 and 8 degrees C (36 and 46 degrees F). Do not freeze or use if the   medicine has been frozen. Protect from light and excessive heat. Store in the carton until use. Each single-dose pen can be kept at room temperature, not to exceed 30 degrees C (86 degrees F) for a total of 14 days, if needed. Throw away any unused medicine after the expiration date on the label. NOTE: This sheet is a summary. It may not cover all possible information. If  you have questions about this medicine, talk to your doctor, pharmacist, or health care provider.  2018 [employer] Standard (2016-12-16 14:35:01)  

## 2017-10-03 NOTE — Progress Notes (Signed)
Patient: Carolyn MeiersStacy G Kerth Female    DOB: Aug 22, 1971   46 y.o.   MRN: [id number] Visit Date: 10/03/2017  Today's Provider: Margaretann LovelessJennifer M Shanora Christensen, PA-C   Chief Complaint  Patient presents with  . Diabetes   Subjective:    HPI  Diabetes Mellitus Type II, Follow-up:   Lab Results  Component Value Date   HGBA1C 6.9 10/03/2017   HGBA1C 8.8 07/04/2017   HGBA1C 7.2 04/04/2017    Last seen for diabetes 2 months ago.  Management since then includes added Trulicity. She reports excellent compliance with treatment. She is not having side effects.  Current symptoms include none and have been improving. Home blood sugar records: fasting range: 100-120s  Episodes of hypoglycemia? no   Current Insulin Regimen: none Most Recent Eye Exam: UTD Weight trend: decreasing steadily Prior visit with dietician: no Current diet: in general, a "healthy" diet   Current exercise: none  Pertinent Labs:    Component Value Date/Time   CHOL 205 (H) 01/03/2017 1030   TRIG 87 01/03/2017 1030   HDL 74 01/03/2017 1030   LDLCALC 114 (H) 01/03/2017 1030   CREATININE 0.77 01/03/2017 1030    Wt Readings from Last 3 Encounters:  10/03/17 270 lb (122.5 kg)  07/04/17 274 lb (124.3 kg)  04/04/17 273 lb 6.4 oz (124 kg)   ------------------------------------------------------------------------     No Known Allergies   Current Outpatient Prescriptions:  .  aspirin 81 MG tablet, BAYER LOW STRENGTH, 81MG  (Oral Tablet Delayed Release)  1 Every Day for 0 days  Quantity: 0.00;  Refills: 0   Ordered :05-Oct-2010  Amie CritchleyMitchell, Stephanie ;  Started 09-May-2007 Active Comments: DX: 250.00, Disp: , Rfl:  .  azelastine (ASTELIN) 0.1 % nasal spray, instill 1 spray into each nostril twice a day, Disp: , Rfl: 0 .  Calcium Carbonate-Vitamin D 600-200 MG-UNIT CAPS, CALCIUM + D, 600-200MG -UNIT (Oral Tablet)  1 Twice Daily for 0 days  Quantity: 0.00;  Refills: 0   Ordered :05-Oct-2010  Kavin LeechWalsh, Laura ;  Started  03-July-2010 Active, Disp: , Rfl:  .  Cetirizine HCl 10 MG CAPS, Take by mouth., Disp: , Rfl:  .  cyclobenzaprine (FLEXERIL) 5 MG tablet, take 1 tablet by mouth at bedtime if needed for muscle spasm, Disp: 30 tablet, Rfl: 5 .  doxycycline (VIBRAMYCIN) 100 MG capsule, , Disp: , Rfl: 0 .  Dulaglutide (TRULICITY) 0.75 MG/0.5ML SOPN, Inject 0.75 mg into the skin once a week., Disp: 4 pen, Rfl: 5 .  fluticasone (FLONASE) 50 MCG/ACT nasal spray, FLONASE, 50MCG/ACT (Nasal Suspension)  two sprays each nostril, as needed for 0 days  Quantity: 0.00;  Refills: 0   Ordered :05-Oct-2010  Amie CritchleyMitchell, Stephanie ;  Started 09-May-2007 Active Comments: Medication taken as needed. DX: 477.9, Disp: , Rfl:  .  IRON-VITAMINS PO, Take by mouth., Disp: , Rfl:  .  JANUVIA 100 MG tablet, take 1 tablet by mouth once daily, Disp: 30 tablet, Rfl: 5 .  meloxicam (MOBIC) 15 MG tablet, take 1 tablet by mouth once daily if needed, Disp: 30 tablet, Rfl: 11 .  metFORMIN (GLUCOPHAGE) 1000 MG tablet, take 1 tablet by mouth twice a day with food, Disp: 60 tablet, Rfl: 5 .  montelukast (SINGULAIR) 10 MG tablet, take 1 tablet by mouth at bedtime, Disp: 90 tablet, Rfl: 1 .  MULTIPLE VITAMIN PO, MULTIVITAMINS (Oral Tablet)  1 Every Day for 0 days  Quantity: 0.00;  Refills: 0   Ordered :05-Oct-2010  Kavin LeechWalsh, Laura ;  Started 18-Mar-2010 Active Comments: DX: 250.00, Disp: , Rfl:  .  Potassium 99 MG TABS, Take by mouth., Disp: , Rfl:  .  ramipril (ALTACE) 2.5 MG capsule, take 1 capsule by mouth once daily, Disp: 90 capsule, Rfl: 1 .  triamcinolone ointment (KENALOG) 0.1 %, APPLY UNDER BREAST ONCE A DAY, Disp: , Rfl: 0 .  triamterene-hydrochlorothiazide (MAXZIDE-25) 37.5-25 MG tablet, take 1 tablet by mouth once daily, Disp: 90 tablet, Rfl: 1 .  nystatin cream (MYCOSTATIN), Apply 1 application topically 2 (two) times daily. (Patient not taking: Reported on 10/03/2017), Disp: 30 g, Rfl: 0  Review of Systems  Constitutional: Negative.     Respiratory: Negative.   Cardiovascular: Negative.   Gastrointestinal: Negative.   Endocrine: Negative.   Neurological: Negative.     Social History  Substance Use Topics  . Smoking status: Never Smoker  . Smokeless tobacco: Never Used  . Alcohol use Yes     Comment: Occasional Wine   Objective:   BP 120/80 (BP Location: Left Wrist, Patient Position: Sitting, Cuff Size: Large)   Pulse (!) 101   Temp 98.3 F (36.8 C) (Oral)   Resp 16   Wt 270 lb (122.5 kg)   LMP 09/13/2017   BMI 46.35 kg/m  Vitals:   10/03/17 1647  BP: 120/80  Pulse: (!) 101  Resp: 16  Temp: 98.3 F (36.8 C)  TempSrc: Oral  Weight: 270 lb (122.5 kg)     Physical Exam  Constitutional: She appears well-developed and well-nourished. No distress.  Neck: Normal range of motion. Neck supple. No JVD present. No tracheal deviation present. No thyromegaly present.  Cardiovascular: Normal rate, regular rhythm and normal heart sounds.  Exam reveals no gallop and no friction rub.   No murmur heard. Pulmonary/Chest: Effort normal and breath sounds normal. No respiratory distress. She has no wheezes. She has no rales.  Musculoskeletal: She exhibits no edema.  Lymphadenopathy:    She has no cervical adenopathy.  Skin: She is not diaphoretic.  Vitals reviewed.       Assessment & Plan:     1. Type 2 diabetes mellitus without complication, without long-term current use of insulin (HCC) A1c improved to 6.9 from 8.8 with addition of Trulicity. Continue current medical treatment plan. I will see her back in 3 months to recheck.  - POCT glycosylated hemoglobin (Hb A1C) - Dulaglutide (TRULICITY) 0.75 MG/0.5ML SOPN; Inject 0.75 mg into the skin once a week.  Dispense: 4 pen; Refill: 5  2. BMI 45.0-49.9, adult [facility]) Counseled patient on healthy lifestyle modifications including dieting and exercise.        Margaretann Loveless, PA-C  [facility]

## 2017-10-04 ENCOUNTER — Encounter: Payer: Self-pay | Admitting: Physician Assistant

## 2017-10-05 NOTE — Telephone Encounter (Signed)
[facility]

## 2017-11-04 ENCOUNTER — Other Ambulatory Visit: Payer: Self-pay | Admitting: Physician Assistant

## 2017-12-13 HISTORY — PX: BREAST BIOPSY: SHX20

## 2017-12-20 ENCOUNTER — Encounter: Payer: Self-pay | Admitting: Physician Assistant

## 2017-12-30 ENCOUNTER — Other Ambulatory Visit: Payer: Self-pay | Admitting: Physician Assistant

## 2017-12-30 DIAGNOSIS — E119 Type 2 diabetes mellitus without complications: Secondary | ICD-10-CM

## 2018-01-05 ENCOUNTER — Ambulatory Visit (INDEPENDENT_AMBULATORY_CARE_PROVIDER_SITE_OTHER): Payer: 59 | Admitting: Physician Assistant

## 2018-01-05 ENCOUNTER — Encounter: Payer: Self-pay | Admitting: Physician Assistant

## 2018-01-05 VITALS — BP 142/78 | HR 88 | Temp 98.5°F | Resp 16 | Wt 269.0 lb

## 2018-01-05 DIAGNOSIS — Z1239 Encounter for other screening for malignant neoplasm of breast: Secondary | ICD-10-CM

## 2018-01-05 DIAGNOSIS — Z6841 Body Mass Index (BMI) 40.0 and over, adult: Secondary | ICD-10-CM | POA: Diagnosis not present

## 2018-01-05 DIAGNOSIS — Z1231 Encounter for screening mammogram for malignant neoplasm of breast: Secondary | ICD-10-CM

## 2018-01-05 DIAGNOSIS — I10 Essential (primary) hypertension: Secondary | ICD-10-CM | POA: Diagnosis not present

## 2018-01-05 DIAGNOSIS — E119 Type 2 diabetes mellitus without complications: Secondary | ICD-10-CM

## 2018-01-05 DIAGNOSIS — Z0000 Encounter for general adult medical examination without abnormal findings: Secondary | ICD-10-CM

## 2018-01-05 NOTE — Patient Instructions (Signed)

## 2018-01-05 NOTE — Progress Notes (Signed)
Patient: Carolyn Nolan, Female    DOB: 15-Nov-1971, 47 y.o.   MRN: [id number] Visit Date: 01/05/2018  Today's Provider: Margaretann Loveless, PA-C   Chief Complaint  Patient presents with  . Annual Exam   Subjective:    Annual physical exam Carolyn Nolan is a 47 y.o. female who presents today for health maintenance and complete physical. She feels overall doing well. She reports exercising not at this time. She reports she is sleeping well.  Last pap smear was done 01/27/2017 Negative and HPV was negative- not sexually active. Mammogram 01/28/2017 negative Colonoscopy 08/15/2007 hemorrhoids, otherwise normal. Done by Dr Servando Snare. Routine lab work done on 01/03/2017  Discussed starting spironolactone for acne as her dermatologist wishes to do. I advised I would most likely stop her triamterene and do HCTZ alone with the spironolactone due to the fact that I would not want her on 2 potassium sparing diuretics. I also advised I would check labs to make sure patient is stable and would want to do a BP f/u to make sure no changes. Patient declines wanting to start spironolactone at this time.   Review of Systems  Constitutional: Negative.   HENT: Positive for dental problem and sinus pressure.   Eyes: Negative.   Respiratory: Negative.   Cardiovascular: Negative.   Gastrointestinal: Negative.   Endocrine: Negative.   Genitourinary: Positive for genital sores.  Musculoskeletal: Positive for gait problem.  Skin: Negative.   Allergic/Immunologic: Negative.   Hematological: Negative.   Psychiatric/Behavioral: Negative.     Social History      She  reports that  has never smoked. she has never used smokeless tobacco. She reports that she drinks alcohol. She reports that she does not use drugs.       Social History   Socioeconomic History  . Marital status: Single    Spouse name: None  . Number of children: None  . Years of education: None  . Highest education level: None  Social  Needs  . Financial resource strain: None  . Food insecurity - worry: None  . Food insecurity - inability: None  . Transportation needs - medical: None  . Transportation needs - non-medical: None  Occupational History    Employer: [employer]  Tobacco Use  . Smoking status: Never Smoker  . Smokeless tobacco: Never Used  Substance and Sexual Activity  . Alcohol use: Yes    Comment: Occasional Wine  . Drug use: No  . Sexual activity: No  Other Topics Concern  . None  Social History Narrative  . None    History reviewed. No pertinent past medical history.   Patient Active Problem List   Diagnosis Date Noted  . Costalchondritis 10/06/2015  . Allergic rhinitis 04/29/2015  . Adult BMI 30+ 04/29/2015  . Deep vein thrombosis (HCC) 04/29/2015  . Burning or prickling sensation 04/29/2015  . Anemia, iron deficiency 06/24/2009  . Diabetes mellitus, type 2 (HCC) 08/19/2000  . BP (high blood pressure) 02/07/1996    Past Surgical History:  Procedure Laterality Date  . NO PAST SURGERIES      Family History        Family Status  Relation Name Status  . Mother  Alive  . Father  Deceased  . Sister  Alive  . Neg Hx  (Not Specified)        Her family history includes Diabetes in her father and mother; Healthy in her sister; Hypertension in her father.  No Known Allergies   Current Outpatient Medications:  .  aspirin 81 MG tablet, BAYER LOW STRENGTH, 81MG  (Oral Tablet Delayed Release)  1 Every Day for 0 days  Quantity: 0.00;  Refills: 0   Ordered :05-Oct-2010  Amie Critchley ;  Started 09-May-2007 Active Comments: DX: 250.00, Disp: , Rfl:  .  azelastine (ASTELIN) 0.1 % nasal spray, instill 1 spray into each nostril twice a day, Disp: , Rfl: 0 .  Calcium Carbonate-Vitamin D 600-200 MG-UNIT CAPS, CALCIUM + D, 600-200MG -UNIT (Oral Tablet)  1 Twice Daily for 0 days  Quantity: 0.00;  Refills: 0   Ordered :05-Oct-2010  Kavin Leech ;  Started 03-July-2010 Active,  Disp: , Rfl:  .  Cetirizine HCl 10 MG CAPS, Take by mouth., Disp: , Rfl:  .  cyclobenzaprine (FLEXERIL) 5 MG tablet, take 1 tablet by mouth at bedtime if needed for muscle spasm, Disp: 30 tablet, Rfl: 5 .  doxycycline (VIBRAMYCIN) 100 MG capsule, Take 100 mg by mouth daily. , Disp: , Rfl: 0 .  Dulaglutide (TRULICITY) 0.75 MG/0.5ML SOPN, Inject 0.75 mg into the skin once a week., Disp: 4 pen, Rfl: 5 .  IRON-VITAMINS PO, Take by mouth., Disp: , Rfl:  .  JANUVIA 100 MG tablet, take 1 tablet by mouth once daily, Disp: 30 tablet, Rfl: 0 .  meloxicam (MOBIC) 15 MG tablet, take 1 tablet by mouth once daily if needed, Disp: 30 tablet, Rfl: 11 .  metFORMIN (GLUCOPHAGE) 1000 MG tablet, take 1 tablet by mouth twice a day with food, Disp: 60 tablet, Rfl: 5 .  montelukast (SINGULAIR) 10 MG tablet, take 1 tablet by mouth at bedtime, Disp: 90 tablet, Rfl: 1 .  MULTIPLE VITAMIN PO, MULTIVITAMINS (Oral Tablet)  1 Every Day for 0 days  Quantity: 0.00;  Refills: 0   Ordered :05-Oct-2010  Kavin Leech ;  Started 18-Mar-2010 Active Comments: DX: 250.00, Disp: , Rfl:  .  Potassium 99 MG TABS, Take by mouth., Disp: , Rfl:  .  ramipril (ALTACE) 2.5 MG capsule, take 1 capsule by mouth once daily, Disp: 90 capsule, Rfl: 1 .  Tretinoin Microsphere (RETIN-A MICRO PUMP) 0.08 % GEL, Apply topically at bedtime., Disp: , Rfl:  .  triamterene-hydrochlorothiazide (MAXZIDE-25) 37.5-25 MG tablet, take 1 tablet by mouth once daily, Disp: 90 tablet, Rfl: 1   Patient Care Team: Margaretann Loveless, PA-C as PCP - General (Family Medicine)      Objective:   Vitals: BP (!) 142/78   Pulse 88   Temp 98.5 F (36.9 C)   Resp 16   Wt 269 lb (122 kg)   BMI 46.17 kg/m    Vitals:   01/05/18 1020  BP: (!) 142/78  Pulse: 88  Resp: 16  Temp: 98.5 F (36.9 C)  Weight: 269 lb (122 kg)     Physical Exam  Constitutional: She is oriented to person, place, and time. She appears well-developed and well-nourished. No distress.  HENT:   Head: Normocephalic and atraumatic.  Right Ear: Hearing, tympanic membrane, external ear and ear canal normal.  Left Ear: Hearing, tympanic membrane, external ear and ear canal normal.  Nose: Nose normal.  Mouth/Throat: Uvula is midline, oropharynx is clear and moist and mucous membranes are normal. No oropharyngeal exudate.  Eyes: Conjunctivae and EOM are normal. Pupils are equal, round, and reactive to light. Right eye exhibits no discharge. Left eye exhibits no discharge. No scleral icterus.  Neck: Normal range of motion. Neck supple. No JVD present. No tracheal deviation  present. No thyromegaly present.  Cardiovascular: Normal rate, regular rhythm, normal heart sounds and intact distal pulses. Exam reveals no gallop and no friction rub.  No murmur heard. Pulmonary/Chest: Effort normal and breath sounds normal. No respiratory distress. She has no wheezes. She has no rales. She exhibits no tenderness. Right breast exhibits no inverted nipple, no mass, no nipple discharge, no skin change and no tenderness. Left breast exhibits no inverted nipple, no mass, no nipple discharge, no skin change and no tenderness. Breasts are symmetrical.  Abdominal: Soft. Bowel sounds are normal. She exhibits no distension and no mass. There is no tenderness. There is no rebound and no guarding.  Musculoskeletal: Normal range of motion. She exhibits no edema or tenderness.  Lymphadenopathy:    She has no cervical adenopathy.  Neurological: She is alert and oriented to person, place, and time.  Skin: Skin is warm and dry. No rash noted. She is not diaphoretic.  Psychiatric: She has a normal mood and affect. Her behavior is normal. Judgment and thought content normal.  Vitals reviewed.    Depression Screen PHQ 2/9 Scores 01/05/2018 07/04/2017 01/02/2016  PHQ - 2 Score 1 2 1   PHQ- 9 Score 2 4 -    Assessment & Plan:    1. Annual physical exam Normal physical exam today. Will check labs as below and f/u pending  lab results. If labs are stable and WNL she will not need to have these rechecked for one year at her next annual physical exam. She is to call the office in the meantime if she has any acute issue, questions or concerns. - CBC w/Diff/Platelet - Comprehensive Metabolic Panel (CMET) - TSH  2. Breast cancer screening Breast exam today was normal. There is no family history of breast cancer. She does perform regular self breast exams. Mammogram was ordered as below. [facility] given to patient so she may schedule her mammogram at her convenience. - MM Digital Screening; Future  3. Type 2 diabetes mellitus without complication, without long-term current use of insulin (HCC) Stable today. Will check labs as below. Continue Januvia 100mg , metformin 1000mg  BID, and trulicity 0.75mg  weekly injection.  - POCT UA - Microalbumin - Comprehensive Metabolic Panel (CMET) - Lipid Profile - HgB A1c  4. Essential hypertension Slightly elevated today but at home readings are reported normal. Continue Ramipril 2.5mg  and Maxzide 37.5-25mg . Will check labs as below and f/u pending results. - Comprehensive Metabolic Panel (CMET) - Lipid Profile - HgB A1c  5. Class 3 severe obesity due to excess calories with serious comorbidity and body mass index (BMI) of 45.0 to 49.9 in adult [facility](HCC) Counseled patient on healthy lifestyle modifications including dieting and exercise.  - Comprehensive Metabolic Panel (CMET) - Lipid Profile - HgB A1c  Margaretann LovelessJennifer M Sie Formisano, PA-C  [facility]

## 2018-01-06 LAB — HEMOGLOBIN A1C
Est. average glucose Bld gHb Est-mCnc: 143 mg/dL
HEMOGLOBIN A1C: 6.6 % — AB (ref 4.8–5.6)

## 2018-01-06 LAB — CBC WITH DIFFERENTIAL/PLATELET
BASOS: 0 %
Basophils Absolute: 0 10*3/uL (ref 0.0–0.2)
EOS (ABSOLUTE): 0.1 10*3/uL (ref 0.0–0.4)
EOS: 1 %
HEMOGLOBIN: 10.9 g/dL — AB (ref 11.1–15.9)
Hematocrit: 34.8 % (ref 34.0–46.6)
IMMATURE GRANS (ABS): 0 10*3/uL (ref 0.0–0.1)
IMMATURE GRANULOCYTES: 0 %
LYMPHS: 21 %
Lymphocytes Absolute: 2.3 10*3/uL (ref 0.7–3.1)
MCH: 27.3 pg (ref 26.6–33.0)
MCHC: 31.3 g/dL — ABNORMAL LOW (ref 31.5–35.7)
MCV: 87 fL (ref 79–97)
MONOCYTES: 4 %
Monocytes Absolute: 0.5 10*3/uL (ref 0.1–0.9)
NEUTROS PCT: 74 %
Neutrophils Absolute: 8.1 10*3/uL — ABNORMAL HIGH (ref 1.4–7.0)
PLATELETS: 415 10*3/uL — AB (ref 150–379)
RBC: 3.99 x10E6/uL (ref 3.77–5.28)
RDW: 14.9 % (ref 12.3–15.4)
WBC: 10.9 10*3/uL — ABNORMAL HIGH (ref 3.4–10.8)

## 2018-01-06 LAB — TSH: TSH: 1 u[IU]/mL (ref 0.450–4.500)

## 2018-01-06 LAB — COMPREHENSIVE METABOLIC PANEL
ALBUMIN: 4.1 g/dL (ref 3.5–5.5)
ALT: 20 IU/L (ref 0–32)
AST: 13 IU/L (ref 0–40)
Albumin/Globulin Ratio: 1.4 (ref 1.2–2.2)
Alkaline Phosphatase: 63 IU/L (ref 39–117)
BUN/Creatinine Ratio: 14 (ref 9–23)
BUN: 11 mg/dL (ref 6–24)
Bilirubin Total: <0.2 mg/dL (ref 0.0–1.2)
CALCIUM: 9.1 mg/dL (ref 8.7–10.2)
CO2: 25 mmol/L (ref 20–29)
Chloride: 102 mmol/L (ref 96–106)
Creatinine, Ser: 0.77 mg/dL (ref 0.57–1.00)
GFR, EST AFRICAN AMERICAN: 106 mL/min/{1.73_m2} (ref >59)
GFR, EST NON AFRICAN AMERICAN: 92 mL/min/{1.73_m2} (ref >59)
Globulin, Total: 3 g/dL (ref 1.5–4.5)
Glucose: 131 mg/dL — ABNORMAL HIGH (ref 65–99)
Potassium: 4.2 mmol/L (ref 3.5–5.2)
Sodium: 143 mmol/L (ref 134–144)
TOTAL PROTEIN: 7.1 g/dL (ref 6.0–8.5)

## 2018-01-06 LAB — LIPID PANEL
CHOL/HDL RATIO: 2.5 ratio (ref 0.0–4.4)
Cholesterol, Total: 190 mg/dL (ref 100–199)
HDL: 75 mg/dL (ref >39)
LDL CALC: 103 mg/dL — AB (ref 0–99)
TRIGLYCERIDES: 58 mg/dL (ref 0–149)
VLDL Cholesterol Cal: 12 mg/dL (ref 5–40)

## 2018-01-09 ENCOUNTER — Telehealth: Payer: Self-pay

## 2018-01-09 NOTE — Telephone Encounter (Signed)
-----   Message from Margaretann LovelessJennifer M Burnette, PA-C sent at 01/09/2018  9:55 AM EST ----- A1c down to 6.6 from 6.9! Slight decrease in hemoglobin again so recommend restarting iron supplement if not taking currently. If taking may need to double up on dosage. Thyroid normal. Kidney and liver function normal. Cholesterol improved from last year.

## 2018-01-09 NOTE — Telephone Encounter (Signed)
Pt returning call about lab work 

## 2018-01-09 NOTE — Telephone Encounter (Signed)
Left message to call back  

## 2018-01-10 NOTE — Telephone Encounter (Signed)
Patient advised as below. Patient verbalizes understanding and is in agreement with treatment plan.  

## 2018-01-31 ENCOUNTER — Ambulatory Visit
Admission: RE | Admit: 2018-01-31 | Discharge: 2018-01-31 | Disposition: A | Discharge status: Home / Self Care | Payer: 59 | Source: Ambulatory Visit | Attending: Physician Assistant | Admitting: Physician Assistant

## 2018-01-31 ENCOUNTER — Telehealth: Payer: Self-pay

## 2018-01-31 DIAGNOSIS — Z1231 Encounter for screening mammogram for malignant neoplasm of breast: Secondary | ICD-10-CM | POA: Insufficient documentation

## 2018-01-31 DIAGNOSIS — Z1239 Encounter for other screening for malignant neoplasm of breast: Secondary | ICD-10-CM

## 2018-01-31 NOTE — Telephone Encounter (Signed)
-----   Message from Margaretann LovelessJennifer M Burnette, PA-C sent at 01/31/2018 11:38 AM EST ----- Normal mammogram. Repeat screening in one year.

## 2018-01-31 NOTE — Telephone Encounter (Signed)
Patient advised as below.  

## 2018-02-15 ENCOUNTER — Encounter: Payer: Self-pay | Admitting: Physician Assistant

## 2018-02-16 ENCOUNTER — Other Ambulatory Visit: Payer: Self-pay | Admitting: Family Medicine

## 2018-02-16 DIAGNOSIS — E119 Type 2 diabetes mellitus without complications: Secondary | ICD-10-CM

## 2018-03-01 ENCOUNTER — Other Ambulatory Visit: Payer: Self-pay | Admitting: Physician Assistant

## 2018-03-01 DIAGNOSIS — I10 Essential (primary) hypertension: Secondary | ICD-10-CM

## 2018-03-21 ENCOUNTER — Other Ambulatory Visit: Payer: Self-pay | Admitting: Physician Assistant

## 2018-03-21 DIAGNOSIS — I10 Essential (primary) hypertension: Secondary | ICD-10-CM

## 2018-06-11 ENCOUNTER — Other Ambulatory Visit: Payer: Self-pay | Admitting: Physician Assistant

## 2018-06-11 DIAGNOSIS — E119 Type 2 diabetes mellitus without complications: Secondary | ICD-10-CM

## 2018-07-10 ENCOUNTER — Encounter: Payer: Self-pay | Admitting: Physician Assistant

## 2018-07-10 ENCOUNTER — Ambulatory Visit: Payer: 59 | Admitting: Physician Assistant

## 2018-07-10 ENCOUNTER — Ambulatory Visit: Payer: Self-pay | Admitting: Physician Assistant

## 2018-07-10 VITALS — BP 140/80 | HR 109 | Temp 98.0°F | Resp 16 | Ht 64.0 in | Wt 270.0 lb

## 2018-07-10 DIAGNOSIS — E119 Type 2 diabetes mellitus without complications: Secondary | ICD-10-CM | POA: Diagnosis not present

## 2018-07-10 LAB — POCT GLYCOSYLATED HEMOGLOBIN (HGB A1C)
Est. average glucose Bld gHb Est-mCnc: 180
Hemoglobin A1C: 7.9 % — AB (ref 4.0–5.6)

## 2018-07-10 NOTE — Progress Notes (Signed)
Patient: Carolyn Nolan Female    DOB: 11-10-1971   47 y.o.   MRN: [id number] Visit Date: 07/10/2018  Today's Provider: Margaretann LovelessJennifer M Shavonda Wiedman, PA-C   Chief Complaint  Patient presents with  . Follow-up    T2DM   Subjective:    HPI  Diabetes Mellitus Type II, Follow-up:   Lab Results  Component Value Date   HGBA1C 7.9 (A) 07/10/2018   HGBA1C 6.6 (H) 01/05/2018   HGBA1C 6.9 10/03/2017    Last seen for diabetes 6 months ago.  Management since then includes continue Januvia 100mg , Metformin 1000mg  BID and Trulicity 0.75 mg weekly injection. She reports excellent compliance with treatment. She is not having side effects.  Current symptoms include polyuria and have been stable. Home blood sugar records: no checking  Episodes of hypoglycemia? no   Current Insulin Regimen: Trulicity Most Recent Eye Exam: 09/2017 Weight trend: stable Prior visit with dietician: no Current diet: in general, an "unhealthy" diet Current exercise: none  Pertinent Labs:    Component Value Date/Time   CHOL 190 01/05/2018 1123   TRIG 58 01/05/2018 1123   HDL 75 01/05/2018 1123   LDLCALC 103 (H) 01/05/2018 1123   CREATININE 0.77 01/05/2018 1123    Wt Readings from Last 3 Encounters:  07/10/18 270 lb (122.5 kg)  01/05/18 269 lb (122 kg)  10/03/17 270 lb (122.5 kg)   ------------------------------------------------------------------------    No Known Allergies   Current Outpatient Medications:  .  aspirin 81 MG tablet, BAYER LOW STRENGTH, 81MG  (Oral Tablet Delayed Release)  1 Every Day for 0 days  Quantity: 0.00;  Refills: 0   Ordered :05-Oct-2010  Amie CritchleyMitchell, Stephanie ;  Started 09-May-2007 Active Comments: DX: 250.00, Disp: , Rfl:  .  azelastine (ASTELIN) 0.1 % nasal spray, instill 1 spray into each nostril twice a day, Disp: , Rfl: 0 .  Calcium Carbonate-Vitamin D 600-200 MG-UNIT CAPS, CALCIUM + D, 600-200MG -UNIT (Oral Tablet)  1 Twice Daily for 0 days  Quantity: 0.00;  Refills: 0    Ordered :05-Oct-2010  Kavin LeechWalsh, Laura ;  Started 03-July-2010 Active, Disp: , Rfl:  .  Cetirizine HCl 10 MG CAPS, Take by mouth., Disp: , Rfl:  .  cyclobenzaprine (FLEXERIL) 5 MG tablet, take 1 tablet by mouth at bedtime if needed for muscle spasm, Disp: 30 tablet, Rfl: 5 .  doxycycline (VIBRAMYCIN) 100 MG capsule, Take 100 mg by mouth daily. , Disp: , Rfl: 0 .  IRON-VITAMINS PO, Take by mouth., Disp: , Rfl:  .  JANUVIA 100 MG tablet, TAKE 1 TABLET BY MOUTH ONCE DAILY, Disp: 90 tablet, Rfl: 1 .  meloxicam (MOBIC) 15 MG tablet, take 1 tablet by mouth once daily if needed, Disp: 30 tablet, Rfl: 11 .  metFORMIN (GLUCOPHAGE) 1000 MG tablet, take 1 tablet by mouth twice a day with food, Disp: 60 tablet, Rfl: 5 .  montelukast (SINGULAIR) 10 MG tablet, take 1 tablet by mouth at bedtime, Disp: 90 tablet, Rfl: 1 .  MULTIPLE VITAMIN PO, MULTIVITAMINS (Oral Tablet)  1 Every Day for 0 days  Quantity: 0.00;  Refills: 0   Ordered :05-Oct-2010  Kavin LeechWalsh, Laura ;  Started 18-Mar-2010 Active Comments: DX: 250.00, Disp: , Rfl:  .  Potassium 99 MG TABS, Take by mouth., Disp: , Rfl:  .  ramipril (ALTACE) 2.5 MG capsule, TAKE 1 CAPSULE BY MOUTH ONCE DAILY, Disp: 90 capsule, Rfl: 2 .  Tretinoin Microsphere (RETIN-A MICRO PUMP) 0.08 % GEL, Apply topically at bedtime.,  Disp: , Rfl:  .  triamterene-hydrochlorothiazide (MAXZIDE-25) 37.5-25 MG tablet, TAKE 1 TABLET BY MOUTH ONCE DAILY., Disp: 90 tablet, Rfl: 1 .  TRULICITY 0.75 MG/0.5ML SOPN, INJECT 0.75 MG UNDER THE SKIN ONCE A WEEK, Disp: 2 mL, Rfl: 11  Review of Systems  Constitutional: Negative.   Respiratory: Negative.   Cardiovascular: Negative.   Gastrointestinal: Negative.   Genitourinary: Negative.   Neurological: Negative.     Social History   Tobacco Use  . Smoking status: Never Smoker  . Smokeless tobacco: Never Used  Substance Use Topics  . Alcohol use: Yes    Comment: Occasional Wine   Objective:   BP 140/80 (BP Location: Left Wrist, Patient  Position: Sitting, Cuff Size: Normal)   Pulse (!) 109   Temp 98 F (36.7 C) (Oral)   Resp 16   Ht 5\' 4"  (1.626 m)   Wt 270 lb (122.5 kg)   SpO2 98%   BMI 46.35 kg/m  Vitals:   07/10/18 1552  BP: 140/80  Pulse: (!) 109  Resp: 16  Temp: 98 F (36.7 C)  TempSrc: Oral  SpO2: 98%  Weight: 270 lb (122.5 kg)  Height: 5\' 4"  (1.626 m)     Physical Exam  Constitutional: She appears well-developed and well-nourished. No distress.  Neck: Normal range of motion. Neck supple. No JVD present. No tracheal deviation present. No thyromegaly present.  Cardiovascular: Normal rate, regular rhythm and normal heart sounds. Exam reveals no gallop and no friction rub.  No murmur heard. Pulmonary/Chest: Effort normal and breath sounds normal. No respiratory distress. She has no wheezes. She has no rales.  Musculoskeletal: She exhibits no edema.  Lymphadenopathy:    She has no cervical adenopathy.  Skin: She is not diaphoretic.  Vitals reviewed.      Assessment & Plan:     1. Type 2 diabetes mellitus without complication, without long-term current use of insulin (HCC) A1c up to 7.9 but patient admits to not checking blood sugars or being compliant with diet habits. She has been taking her metformin 1000mg  BID, januvia 100mg  and Trulicity 0.75mg  weekly injection. Discussed importance of checking blood sugars at least once daily. Also discussed lifestyle modifications. I will see her back in 6 months for her CPE. - POCT glycosylated hemoglobin (Hb A1C)       Margaretann Loveless, PA-C  [facility]

## 2018-07-10 NOTE — Patient Instructions (Signed)

## 2018-08-14 ENCOUNTER — Other Ambulatory Visit: Payer: Self-pay | Admitting: Physician Assistant

## 2018-08-14 DIAGNOSIS — I10 Essential (primary) hypertension: Secondary | ICD-10-CM

## 2018-08-14 DIAGNOSIS — E119 Type 2 diabetes mellitus without complications: Secondary | ICD-10-CM

## 2018-10-09 LAB — HM DIABETES EYE EXAM

## 2018-10-17 ENCOUNTER — Encounter: Payer: Self-pay | Admitting: Physician Assistant

## 2018-11-06 ENCOUNTER — Encounter: Payer: Self-pay | Admitting: Physician Assistant

## 2018-11-07 NOTE — Telephone Encounter (Signed)
Josie,  Can you abstract and document her flu vaccine from 09/14/18.  Thanks, JB

## 2018-11-24 ENCOUNTER — Other Ambulatory Visit: Payer: Self-pay | Admitting: Physician Assistant

## 2018-11-24 DIAGNOSIS — M62838 Other muscle spasm: Secondary | ICD-10-CM

## 2018-11-29 ENCOUNTER — Encounter: Payer: Self-pay | Admitting: Physician Assistant

## 2018-11-30 ENCOUNTER — Encounter: Payer: Self-pay | Admitting: Physician Assistant

## 2018-12-12 ENCOUNTER — Other Ambulatory Visit: Payer: Self-pay | Admitting: Physician Assistant

## 2018-12-12 DIAGNOSIS — I10 Essential (primary) hypertension: Secondary | ICD-10-CM

## 2018-12-25 ENCOUNTER — Other Ambulatory Visit: Payer: Self-pay | Admitting: Physician Assistant

## 2018-12-25 DIAGNOSIS — Z1231 Encounter for screening mammogram for malignant neoplasm of breast: Secondary | ICD-10-CM

## 2019-01-03 NOTE — Progress Notes (Signed)
Patient: Carolyn Nolan, Female    DOB: 1971-07-29, 48 y.o.   MRN: [id number] Visit Date: 01/08/2019  Today's Provider: Margaretann Loveless, PA-C   Chief Complaint  Patient presents with  . Annual Exam   Subjective:    Annual physical exam Carolyn Nolan is a 48 y.o. female who presents today for health maintenance and complete physical. She feels well. She reports exercising some. She reports she is sleeping fairly well. -----------------------------------------------------------------   Review of Systems  Constitutional: Negative.   HENT: Positive for dental problem, rhinorrhea and sinus pain.   Eyes: Negative.   Respiratory: Negative.   Cardiovascular: Negative.   Gastrointestinal: Negative.   Endocrine: Positive for cold intolerance.  Genitourinary: Positive for enuresis and genital sores (boils).  Musculoskeletal: Negative.   Skin: Negative.   Allergic/Immunologic: Positive for environmental allergies.  Neurological: Negative.   Hematological: Negative.   Psychiatric/Behavioral: Negative.     Social History      She  reports that she has never smoked. She has never used smokeless tobacco. She reports current alcohol use. She reports that she does not use drugs.       Social History   Socioeconomic History  . Marital status: Single    Spouse name: Not on file  . Number of children: Not on file  . Years of education: Not on file  . Highest education level: Not on file  Occupational History    Employer: [employer]  Social Needs  . Financial resource strain: Not on file  . Food insecurity:    Worry: Not on file    Inability: Not on file  . Transportation needs:    Medical: Not on file    Non-medical: Not on file  Tobacco Use  . Smoking status: Never Smoker  . Smokeless tobacco: Never Used  Substance and Sexual Activity  . Alcohol use: Yes    Comment: Occasional Wine  . Drug use: No  . Sexual activity: Never  Lifestyle  . Physical  activity:    Days per week: Not on file    Minutes per session: Not on file  . Stress: Not on file  Relationships  . Social connections:    Talks on phone: Not on file    Gets together: Not on file    Attends religious service: Not on file    Active member of club or organization: Not on file    Attends meetings of clubs or organizations: Not on file    Relationship status: Not on file  Other Topics Concern  . Not on file  Social History Narrative  . Not on file    History reviewed. No pertinent past medical history.   Patient Active Problem List   Diagnosis Date Noted  . Costalchondritis 10/06/2015  . Allergic rhinitis 04/29/2015  . Adult BMI 30+ 04/29/2015  . Deep vein thrombosis (HCC) 04/29/2015  . Burning or prickling sensation 04/29/2015  . Anemia, iron deficiency 06/24/2009  . Diabetes mellitus, type 2 (HCC) 08/19/2000  . BP (high blood pressure) 02/07/1996    Past Surgical History:  Procedure Laterality Date  . NO PAST SURGERIES      Family History        Family Status  Relation Name Status  . Mother  Alive  . Father  Deceased  . Sister  Alive  . Neg Hx  (Not Specified)        Her family history includes Diabetes in her  father and mother; Healthy in her sister; Hypertension in her father. There is no history of Breast cancer.      No Known Allergies   Current Outpatient Medications:  .  aspirin 81 MG tablet, BAYER LOW STRENGTH, 81MG  (Oral Tablet Delayed Release)  1 Every Day for 0 days  Quantity: 0.00;  Refills: 0   Ordered :05-Oct-2010  Amie Critchley ;  Started 09-May-2007 Active Comments: DX: 250.00, Disp: , Rfl:  .  azelastine (ASTELIN) 0.1 % nasal spray, instill 1 spray into each nostril twice a day, Disp: , Rfl: 0 .  Calcium Carbonate-Vitamin D 600-200 MG-UNIT CAPS, CALCIUM + D, 600-200MG -UNIT (Oral Tablet)  1 Twice Daily for 0 days  Quantity: 0.00;  Refills: 0   Ordered :05-Oct-2010  Kavin Leech ;  Started 03-July-2010 Active, Disp: , Rfl:    .  Cetirizine HCl 10 MG CAPS, Take by mouth., Disp: , Rfl:  .  cyclobenzaprine (FLEXERIL) 5 MG tablet, TAKE 1 TABLET BY MOUTH AT BEDTIME AS NEEDED FOR MUSCLE SPASMS, Disp: 30 tablet, Rfl: 3 .  fluticasone (FLONASE) 50 MCG/ACT nasal spray, Place into both nostrils daily., Disp: , Rfl:  .  IRON-VITAMINS PO, Take by mouth., Disp: , Rfl:  .  JANUVIA 100 MG tablet, TAKE 1 TABLET BY MOUTH ONCE DAILY, Disp: 90 tablet, Rfl: 1 .  metFORMIN (GLUCOPHAGE) 1000 MG tablet, TAKE 1 TABLET BY MOUTH TWICE A DAY WITH FOOD, Disp: 180 tablet, Rfl: 1 .  montelukast (SINGULAIR) 10 MG tablet, take 1 tablet by mouth at bedtime, Disp: 90 tablet, Rfl: 1 .  MULTIPLE VITAMIN PO, MULTIVITAMINS (Oral Tablet)  1 Every Day for 0 days  Quantity: 0.00;  Refills: 0   Ordered :05-Oct-2010  Kavin Leech ;  Started 18-Mar-2010 Active Comments: DX: 250.00, Disp: , Rfl:  .  Potassium 99 MG TABS, Take by mouth., Disp: , Rfl:  .  ramipril (ALTACE) 2.5 MG capsule, TAKE ONE CAPSULE BY MOUTH ONCE DAILY, Disp: 90 capsule, Rfl: 0 .  Tretinoin Microsphere (RETIN-A MICRO PUMP) 0.08 % GEL, Apply topically at bedtime., Disp: , Rfl:  .  triamterene-hydrochlorothiazide (MAXZIDE-25) 37.5-25 MG tablet, TAKE 1 TABLET BY MOUTH ONCE DAILY, Disp: 90 tablet, Rfl: 1 .  TRULICITY 0.75 MG/0.5ML SOPN, INJECT 0.75 MG UNDER THE SKIN ONCE A WEEK, Disp: 2 mL, Rfl: 11 .  doxycycline (VIBRAMYCIN) 100 MG capsule, Take 100 mg by mouth daily. , Disp: , Rfl: 0 .  meloxicam (MOBIC) 15 MG tablet, take 1 tablet by mouth once daily if needed (Patient not taking: Reported on 01/08/2019), Disp: 30 tablet, Rfl: 11   Patient Care Team: Margaretann Loveless, PA-C as PCP - General (Family Medicine)      Objective:   Vitals: BP 138/90 (BP Location: Left Arm, Patient Position: Sitting, Cuff Size: Large)   Pulse (!) 109   Temp 98 F (36.7 C) (Oral)   Resp 16   Ht 5\' 4"  (1.626 m)   Wt 272 lb (123.4 kg)   LMP 11/22/2018   SpO2 99%   BMI 46.69 kg/m    Vitals:   01/08/19  0911  BP: 138/90  Pulse: (!) 109  Resp: 16  Temp: 98 F (36.7 C)  TempSrc: Oral  SpO2: 99%  Weight: 272 lb (123.4 kg)  Height: 5\' 4"  (1.626 m)     Physical Exam Vitals signs reviewed.  Constitutional:      General: She is not in acute distress.    Appearance: She is well-developed. She is obese. She  is not diaphoretic.  HENT:     Head: Normocephalic and atraumatic.     Right Ear: Tympanic membrane, ear canal and external ear normal.     Left Ear: Tympanic membrane, ear canal and external ear normal.     Nose: Nose normal.     Mouth/Throat:     Pharynx: No oropharyngeal exudate.  Eyes:     General: No scleral icterus.       Right eye: No discharge.        Left eye: No discharge.     Conjunctiva/sclera: Conjunctivae normal.     Pupils: Pupils are equal, round, and reactive to light.  Neck:     Musculoskeletal: Normal range of motion and neck supple.     Thyroid: No thyromegaly.     Vascular: No JVD.     Trachea: No tracheal deviation.  Cardiovascular:     Rate and Rhythm: Normal rate and regular rhythm.     Heart sounds: Normal heart sounds. No murmur. No friction rub. No gallop.   Pulmonary:     Effort: Pulmonary effort is normal. No respiratory distress.     Breath sounds: Normal breath sounds. No wheezing or rales.  Chest:     Chest wall: No tenderness.  Abdominal:     General: Bowel sounds are normal. There is no distension.     Palpations: Abdomen is soft. There is no mass.     Tenderness: There is no abdominal tenderness. There is no guarding or rebound.  Musculoskeletal: Normal range of motion.        General: No tenderness.  Lymphadenopathy:     Cervical: No cervical adenopathy.  Skin:    General: Skin is warm and dry.     Findings: No rash.  Neurological:     General: No focal deficit present.     Mental Status: She is alert and oriented to person, place, and time.     Cranial Nerves: No cranial nerve deficit.     Sensory: No sensory deficit.      Motor: No weakness.     Gait: Gait normal.  Psychiatric:        Behavior: Behavior normal.        Thought Content: Thought content normal.        Judgment: Judgment normal.    Diabetic Foot Exam - Simple   Simple Foot Form Diabetic Foot exam was performed with the following findings:  Yes 01/08/2019  9:49 AM  Visual Inspection No deformities, no ulcerations, no other skin breakdown bilaterally:  Yes Sensation Testing Intact to touch and monofilament testing bilaterally:  Yes Pulse Check Posterior Tibialis and Dorsalis pulse intact bilaterally:  Yes Comments     Depression Screen PHQ 2/9 Scores 01/08/2019 01/05/2018 07/04/2017 01/02/2016  PHQ - 2 Score 3 1 2 1   PHQ- 9 Score 9 2 4  -      Assessment & Plan:     Routine Health Maintenance and Physical Exam  Exercise Activities and Dietary recommendations Goals    . Exercise 150 minutes per week (moderate activity)       Immunization History  Administered Date(s) Administered  . Influenza-Unspecified 10/14/2015, 10/04/2017, 09/14/2018  . Pneumococcal Polysaccharide-23 10/15/2011  . Td 01/03/2017  . Tdap 03/10/2006    Health Maintenance  Topic Date Due  . FOOT EXAM  04/04/2018  . HEMOGLOBIN A1C  01/10/2019  . OPHTHALMOLOGY EXAM  10/10/2019  . PAP SMEAR-Modifier  01/28/2020  . TETANUS/TDAP  01/03/2027  . INFLUENZA  VACCINE  Completed  . PNEUMOCOCCAL POLYSACCHARIDE VACCINE AGE 26-64 HIGH RISK  Completed  . HIV Screening  Completed     Discussed health benefits of physical activity, and encouraged her to engage in regular exercise appropriate for her age and condition.    1. Annual physical exam Normal physical exam today. Will check labs as below and f/u pending lab results. If labs are stable and WNL she will not need to have these rechecked for one year at her next annual physical exam. She is to call the office in the meantime if she has any acute issue, questions or concerns.  2. Breast cancer  screening Mammogram scheduled for February 2020. Exam normal today.   3. Colon cancer screening Discussed colonoscopy. Patient declines at this time. May consider at the end of the year.   4. Essential hypertension Stable. Continue Ramipril 2.5mg  and Maxzide 37.5-25mg . Will check labs as below and f/u pending results. - CBC w/Diff/Platelet - Comprehensive Metabolic Panel (CMET) - TSH - Lipid Profile - HgB A1c  5. Type 2 diabetes mellitus with other specified complication, without long-term current use of insulin (HCC) Stable. Continue Januvia 100mg  daily, metformin 1000mg  BID. Patient stopped Trulicity again due to not being covered this year by [facility] patient. Will check labs as below and f/u pending results. May add an  SGLT2 inhibitor if A1c up.  - CBC w/Diff/Platelet - Comprehensive Metabolic Panel (CMET) - Lipid Profile - HgB A1c  6. Iron deficiency anemia secondary to inadequate dietary iron intake H/O this. Not on supplement at this time. Will check labs as below and f/u pending results. - CBC w/Diff/Platelet - Comprehensive Metabolic Panel (CMET)  7. Class 3 severe obesity due to excess calories with serious comorbidity and body mass index (BMI) of 45.0 to 49.9 in adult [facility](HCC) Counseled patient on healthy lifestyle modifications including dieting and exercise.  - CBC w/Diff/Platelet - Comprehensive Metabolic Panel (CMET) - TSH - Lipid Profile - HgB A1c  8. Hyperlipidemia associated with type 2 diabetes mellitus (HCC) H/O this. Diet controlled. Patient has declined statins in the past. Will check labs as below and f/u pending results. - Comprehensive Metabolic Panel (CMET) - Lipid Profile - HgB A1c  --------------------------------------------------------------------    Margaretann LovelessJennifer M , PA-C  [facility]

## 2019-01-08 ENCOUNTER — Encounter: Payer: Self-pay | Admitting: Physician Assistant

## 2019-01-08 ENCOUNTER — Ambulatory Visit (INDEPENDENT_AMBULATORY_CARE_PROVIDER_SITE_OTHER): Payer: Managed Care, Other (non HMO) | Admitting: Physician Assistant

## 2019-01-08 VITALS — BP 138/90 | HR 109 | Temp 98.0°F | Resp 16 | Ht 64.0 in | Wt 272.0 lb

## 2019-01-08 DIAGNOSIS — E785 Hyperlipidemia, unspecified: Secondary | ICD-10-CM

## 2019-01-08 DIAGNOSIS — Z6841 Body Mass Index (BMI) 40.0 and over, adult: Secondary | ICD-10-CM | POA: Insufficient documentation

## 2019-01-08 DIAGNOSIS — Z1211 Encounter for screening for malignant neoplasm of colon: Secondary | ICD-10-CM

## 2019-01-08 DIAGNOSIS — Z0000 Encounter for general adult medical examination without abnormal findings: Secondary | ICD-10-CM | POA: Diagnosis not present

## 2019-01-08 DIAGNOSIS — E1169 Type 2 diabetes mellitus with other specified complication: Secondary | ICD-10-CM | POA: Insufficient documentation

## 2019-01-08 DIAGNOSIS — Z1239 Encounter for other screening for malignant neoplasm of breast: Secondary | ICD-10-CM | POA: Diagnosis not present

## 2019-01-08 DIAGNOSIS — I10 Essential (primary) hypertension: Secondary | ICD-10-CM | POA: Diagnosis not present

## 2019-01-08 DIAGNOSIS — D508 Other iron deficiency anemias: Secondary | ICD-10-CM

## 2019-01-08 DIAGNOSIS — E669 Obesity, unspecified: Secondary | ICD-10-CM | POA: Insufficient documentation

## 2019-01-08 DIAGNOSIS — E66813 Obesity, class 3: Secondary | ICD-10-CM

## 2019-01-08 NOTE — Patient Instructions (Signed)

## 2019-01-09 LAB — COMPREHENSIVE METABOLIC PANEL
ALBUMIN: 4.3 g/dL (ref 3.8–4.8)
ALT: 25 IU/L (ref 0–32)
AST: 14 IU/L (ref 0–40)
Albumin/Globulin Ratio: 1.4 (ref 1.2–2.2)
Alkaline Phosphatase: 79 IU/L (ref 39–117)
BUN/Creatinine Ratio: 11 (ref 9–23)
BUN: 10 mg/dL (ref 6–24)
Bilirubin Total: <0.2 mg/dL (ref 0.0–1.2)
CHLORIDE: 98 mmol/L (ref 96–106)
CO2: 23 mmol/L (ref 20–29)
CREATININE: 0.88 mg/dL (ref 0.57–1.00)
Calcium: 9.5 mg/dL (ref 8.7–10.2)
GFR calc non Af Amer: 78 mL/min/{1.73_m2} (ref >59)
GFR, EST AFRICAN AMERICAN: 90 mL/min/{1.73_m2} (ref >59)
GLUCOSE: 194 mg/dL — AB (ref 65–99)
Globulin, Total: 3 g/dL (ref 1.5–4.5)
Potassium: 4.2 mmol/L (ref 3.5–5.2)
Sodium: 139 mmol/L (ref 134–144)
TOTAL PROTEIN: 7.3 g/dL (ref 6.0–8.5)

## 2019-01-09 LAB — CBC WITH DIFFERENTIAL/PLATELET
Basophils Absolute: 0 10*3/uL (ref 0.0–0.2)
Basos: 0 %
EOS (ABSOLUTE): 0.1 10*3/uL (ref 0.0–0.4)
Eos: 1 %
HEMOGLOBIN: 11 g/dL — AB (ref 11.1–15.9)
Hematocrit: 34.8 % (ref 34.0–46.6)
IMMATURE GRANS (ABS): 0 10*3/uL (ref 0.0–0.1)
IMMATURE GRANULOCYTES: 0 %
LYMPHS: 21 %
Lymphocytes Absolute: 2.3 10*3/uL (ref 0.7–3.1)
MCH: 27 pg (ref 26.6–33.0)
MCHC: 31.6 g/dL (ref 31.5–35.7)
MCV: 86 fL (ref 79–97)
MONOCYTES: 7 %
Monocytes Absolute: 0.7 10*3/uL (ref 0.1–0.9)
NEUTROS PCT: 71 %
Neutrophils Absolute: 7.5 10*3/uL — ABNORMAL HIGH (ref 1.4–7.0)
PLATELETS: 402 10*3/uL (ref 150–450)
RBC: 4.07 x10E6/uL (ref 3.77–5.28)
RDW: 12.9 % (ref 11.7–15.4)
WBC: 10.5 10*3/uL (ref 3.4–10.8)

## 2019-01-09 LAB — LIPID PANEL
CHOL/HDL RATIO: 3 ratio (ref 0.0–4.4)
Cholesterol, Total: 204 mg/dL — ABNORMAL HIGH (ref 100–199)
HDL: 67 mg/dL (ref >39)
LDL CALC: 117 mg/dL — AB (ref 0–99)
TRIGLYCERIDES: 100 mg/dL (ref 0–149)
VLDL Cholesterol Cal: 20 mg/dL (ref 5–40)

## 2019-01-09 LAB — HEMOGLOBIN A1C
Est. average glucose Bld gHb Est-mCnc: 174 mg/dL
Hgb A1c MFr Bld: 7.7 % — ABNORMAL HIGH (ref 4.8–5.6)

## 2019-01-09 LAB — TSH: TSH: 3.35 u[IU]/mL (ref 0.450–4.500)

## 2019-01-10 ENCOUNTER — Other Ambulatory Visit: Payer: Self-pay | Admitting: Physician Assistant

## 2019-01-10 DIAGNOSIS — E1169 Type 2 diabetes mellitus with other specified complication: Secondary | ICD-10-CM | POA: Insufficient documentation

## 2019-01-10 DIAGNOSIS — E7800 Pure hypercholesterolemia, unspecified: Secondary | ICD-10-CM

## 2019-01-10 DIAGNOSIS — E785 Hyperlipidemia, unspecified: Secondary | ICD-10-CM | POA: Insufficient documentation

## 2019-01-10 MED ORDER — SIMVASTATIN 10 MG PO TABS: 10.0000 mg | ORAL_TABLET | Freq: Every day | ORAL | 3 refills | Status: DC

## 2019-01-10 NOTE — Progress Notes (Signed)
Simvastatin sent to [facility]

## 2019-02-01 ENCOUNTER — Ambulatory Visit
Admission: RE | Admit: 2019-02-01 | Discharge: 2019-02-01 | Disposition: A | Discharge status: Home / Self Care | Payer: Managed Care, Other (non HMO) | Source: Ambulatory Visit | Attending: Physician Assistant | Admitting: Physician Assistant

## 2019-02-01 ENCOUNTER — Inpatient Hospital Stay: Admission: RE | Admit: 2019-02-01 | Payer: 59 | Source: Ambulatory Visit

## 2019-02-01 DIAGNOSIS — Z1231 Encounter for screening mammogram for malignant neoplasm of breast: Secondary | ICD-10-CM | POA: Diagnosis not present

## 2019-02-02 ENCOUNTER — Telehealth: Payer: Self-pay

## 2019-02-02 NOTE — Telephone Encounter (Signed)
LMTCB

## 2019-02-02 NOTE — Telephone Encounter (Signed)
-----   Message from Margaretann Loveless, [place] sent at 02/02/2019  3:35 PM EST ----- Normal mammogram. Repeat screening in one year.

## 2019-02-05 NOTE — Telephone Encounter (Signed)
Viewed by Carrolyn Meiers on 02/03/2019 5:46 PM

## 2019-02-23 ENCOUNTER — Other Ambulatory Visit: Payer: Self-pay | Admitting: Physician Assistant

## 2019-02-23 DIAGNOSIS — E119 Type 2 diabetes mellitus without complications: Secondary | ICD-10-CM

## 2019-03-12 ENCOUNTER — Other Ambulatory Visit: Payer: Self-pay | Admitting: Physician Assistant

## 2019-03-12 DIAGNOSIS — I10 Essential (primary) hypertension: Secondary | ICD-10-CM

## 2019-03-25 ENCOUNTER — Other Ambulatory Visit: Payer: Self-pay | Admitting: Physician Assistant

## 2019-03-25 DIAGNOSIS — I10 Essential (primary) hypertension: Secondary | ICD-10-CM

## 2019-03-26 NOTE — Telephone Encounter (Signed)
Please Review

## 2019-04-11 ENCOUNTER — Ambulatory Visit: Payer: Self-pay | Admitting: Physician Assistant

## 2019-07-09 ENCOUNTER — Other Ambulatory Visit: Payer: Self-pay

## 2019-07-09 ENCOUNTER — Encounter: Payer: Self-pay | Admitting: Physician Assistant

## 2019-07-09 ENCOUNTER — Ambulatory Visit: Payer: Self-pay | Admitting: Physician Assistant

## 2019-07-09 ENCOUNTER — Ambulatory Visit: Payer: Managed Care, Other (non HMO) | Admitting: Physician Assistant

## 2019-07-09 VITALS — BP 136/86 | HR 86 | Temp 99.1°F | Wt 256.8 lb

## 2019-07-09 DIAGNOSIS — I10 Essential (primary) hypertension: Secondary | ICD-10-CM | POA: Diagnosis not present

## 2019-07-09 DIAGNOSIS — E1169 Type 2 diabetes mellitus with other specified complication: Secondary | ICD-10-CM | POA: Diagnosis not present

## 2019-07-09 DIAGNOSIS — E785 Hyperlipidemia, unspecified: Secondary | ICD-10-CM

## 2019-07-09 DIAGNOSIS — Z6841 Body Mass Index (BMI) 40.0 and over, adult: Secondary | ICD-10-CM

## 2019-07-09 LAB — POCT GLYCOSYLATED HEMOGLOBIN (HGB A1C)
Est. average glucose Bld gHb Est-mCnc: 209
Hemoglobin A1C: 8.9 % — AB (ref 4.0–5.6)

## 2019-07-09 NOTE — Progress Notes (Signed)
Patient: Carolyn Nolan Female    DOB: 14-Mar-1971   48 y.o.   MRN: [id number] Visit Date: 07/09/2019  Today's Provider: Mar Daring, PA-C   Chief Complaint  Patient presents with  . Diabetes  . Hyperlipidemia  . Hypertension   Subjective:     HPI  Diabetes Mellitus Type II, Follow-up:   Lab Results  Component Value Date   HGBA1C 8.9 (A) 07/09/2019   HGBA1C 7.7 (H) 01/08/2019   HGBA1C 7.9 (A) 07/10/2018   Last seen for diabetes 6 months ago.  Management since then includes none. She reports good compliance with treatment. She is not having side effects.  Current symptoms include none and have been stable. Home blood sugar records: not checking fasting bs at home.  Episodes of hypoglycemia? no   Current Insulin Regimen: N/A Most Recent Eye Exam: not done, will call to schedule Weight trend: decreasing steadily Prior visit with dietician: no Current diet: well balanced Current exercise: walking and step climbing per pt  ------------------------------------------------------------------------   Hypertension, follow-up:  BP Readings from Last 3 Encounters:  07/09/19 136/86  01/08/19 138/90  07/10/18 140/80    She was last seen for hypertension 6 months ago.  BP at that visit was 138/90. Management since that visit includes none.She reports good compliance with treatment. She is not having side effects.  She is exercising. She is adherent to low salt diet.   Outside blood pressures are not being checked. She is experiencing none.  Patient denies chest pain, chest pressure/discomfort, exertional chest pressure/discomfort, fatigue, irregular heart beat, palpitations and tachypnea.   Cardiovascular risk factors include diabetes mellitus and hypertension.  Use of agents associated with hypertension: none.   ------------------------------------------------------------------------    Lipid/Cholesterol, Follow-up:   Last seen for this 6 months  ago.  Management since that visit includes none.  Last Lipid Panel:    Component Value Date/Time   CHOL 204 (H) 01/08/2019 0000   TRIG 100 01/08/2019 0000   HDL 67 01/08/2019 0000   CHOLHDL 3.0 01/08/2019 0000   LDLCALC 117 (H) 01/08/2019 0000    She reports good compliance with treatment. She is not having side effects.   Wt Readings from Last 3 Encounters:  07/09/19 256 lb 12.8 oz (116.5 kg)  01/08/19 272 lb (123.4 kg)  07/10/18 270 lb (122.5 kg)   ------------------------------------------------------------------------  No Known Allergies   Current Outpatient Medications:  .  aspirin 81 MG tablet, BAYER LOW STRENGTH, 81MG  (Oral Tablet Delayed Release)  1 Every Day for 0 days  Quantity: 0.00;  Refills: 0   Ordered :05-Oct-2010  Edmonia James ;  Started 09-May-2007 Active Comments: DX: 250.00, Disp: , Rfl:  .  azelastine (ASTELIN) 0.1 % nasal spray, instill 1 spray into each nostril twice a day, Disp: , Rfl: 0 .  Calcium Carbonate-Vitamin D 600-200 MG-UNIT CAPS, CALCIUM + D, 600-200MG -UNIT (Oral Tablet)  1 Twice Daily for 0 days  Quantity: 0.00;  Refills: 0   Ordered :05-Oct-2010  Ashley Royalty ;  Started 03-July-2010 Active, Disp: , Rfl:  .  Cetirizine HCl 10 MG CAPS, Take by mouth., Disp: , Rfl:  .  cyclobenzaprine (FLEXERIL) 5 MG tablet, TAKE 1 TABLET BY MOUTH AT BEDTIME AS NEEDED FOR MUSCLE SPASMS, Disp: 30 tablet, Rfl: 3 .  fluticasone (FLONASE) 50 MCG/ACT nasal spray, Place into both nostrils daily., Disp: , Rfl:  .  IRON-VITAMINS PO, Take by mouth., Disp: , Rfl:  .  JANUVIA 100 MG tablet,  TAKE 1 TABLET BY MOUTH ONCE DAILY, Disp: 90 tablet, Rfl: 1 .  metFORMIN (GLUCOPHAGE) 1000 MG tablet, TAKE 1 TABLET BY MOUTH TWICE DAILY WITH FOOD, Disp: 180 tablet, Rfl: 1 .  montelukast (SINGULAIR) 10 MG tablet, take 1 tablet by mouth at bedtime, Disp: 90 tablet, Rfl: 1 .  MULTIPLE VITAMIN PO, MULTIVITAMINS (Oral Tablet)  1 Every Day for 0 days  Quantity: 0.00;  Refills: 0    Ordered :05-Oct-2010  Kavin LeechWalsh, Laura ;  Started 18-Mar-2010 Active Comments: DX: 250.00, Disp: , Rfl:  .  Potassium 99 MG TABS, Take by mouth., Disp: , Rfl:  .  ramipril (ALTACE) 2.5 MG capsule, TAKE ONE CAPSULE BY MOUTH ONCE DAILY, Disp: 90 capsule, Rfl: 1 .  simvastatin (ZOCOR) 10 MG tablet, Take 1 tablet (10 mg total) by mouth at bedtime., Disp: 90 tablet, Rfl: 3 .  Tretinoin Microsphere (RETIN-A MICRO PUMP) 0.08 % GEL, Apply topically at bedtime., Disp: , Rfl:  .  triamterene-hydrochlorothiazide (MAXZIDE-25) 37.5-25 MG tablet, TAKE 1 TABLET BY MOUTH ONCE DAILY, Disp: 90 tablet, Rfl: 1 .  TRULICITY 0.75 MG/0.5ML SOPN, INJECT 0.75 MG UNDER THE SKIN ONCE A WEEK (Patient not taking: Reported on 07/09/2019), Disp: 2 mL, Rfl: 11  Review of Systems  Constitutional: Negative.   Respiratory: Negative.   Cardiovascular: Negative.   Genitourinary: Negative.   Neurological: Negative.   Hematological: Negative.     Social History   Tobacco Use  . Smoking status: Never Smoker  . Smokeless tobacco: Never Used  Substance Use Topics  . Alcohol use: Yes    Comment: Occasional Wine      Objective:   BP 136/86 (BP Location: Left Arm, Patient Position: Sitting, Cuff Size: Large)   Pulse 86   Temp 99.1 F (37.3 C) (Oral)   Wt 256 lb 12.8 oz (116.5 kg)   SpO2 99%   BMI 44.08 kg/m  Vitals:   07/09/19 1550  BP: 136/86  Pulse: 86  Temp: 99.1 F (37.3 C)  TempSrc: Oral  SpO2: 99%  Weight: 256 lb 12.8 oz (116.5 kg)     Physical Exam Vitals signs reviewed.  Constitutional:      General: She is not in acute distress.    Appearance: Normal appearance. She is well-developed. She is obese. She is not ill-appearing or diaphoretic.  HENT:     Head: Normocephalic and atraumatic.  Eyes:     General: No scleral icterus. Neck:     Musculoskeletal: Normal range of motion and neck supple.     Thyroid: No thyromegaly.     Vascular: No JVD.     Trachea: No tracheal deviation.  Cardiovascular:      Rate and Rhythm: Normal rate and regular rhythm.     Pulses: Normal pulses.     Heart sounds: Normal heart sounds. No murmur. No friction rub. No gallop.   Pulmonary:     Effort: Pulmonary effort is normal. No respiratory distress.     Breath sounds: Normal breath sounds. No wheezing or rales.  Musculoskeletal:     Right lower leg: No edema.     Left lower leg: No edema.  Lymphadenopathy:     Cervical: No cervical adenopathy.  Neurological:     Mental Status: She is alert.      Results for orders placed or performed in visit on 07/09/19  POCT glycosylated hemoglobin (Hb A1C)  Result Value Ref Range   Hemoglobin A1C 8.9 (A) 4.0 - 5.6 %   HbA1c POC (<>  result, manual entry)     HbA1c, POC (prediabetic range)     HbA1c, POC (controlled diabetic range)     Est. average glucose Bld gHb Est-mCnc 209        Assessment & Plan    1. Type 2 diabetes mellitus with other specified complication, without long-term current use of insulin (HCC) A1c increased drastically, but patient has not been taking trulicity (reports insurance did not cover any longer). Will restart Ozempic for patient. Continue all other medications as well. I will see her back in 3 months.  - POCT glycosylated hemoglobin (Hb A1C) - CBC w/Diff/Platelet - Comprehensive Metabolic Panel (CMET) - Lipid Profile - HgB A1c  2. Essential hypertension Stable. Continue current medical treatment plan. Will check labs as below and f/u pending results. - CBC w/Diff/Platelet - Comprehensive Metabolic Panel (CMET) - Lipid Profile - HgB A1c  3. Hyperlipidemia associated with type 2 diabetes mellitus (HCC) On simvastatin 10mg . Will check labs as below and f/u pending results. - CBC w/Diff/Platelet - Comprehensive Metabolic Panel (CMET) - Lipid Profile - HgB A1c  4. Class 3 severe obesity due to excess calories with serious comorbidity and body mass index (BMI) of 40.0 to 44.9 in adult [facility](HCC) Counseled patient on healthy  lifestyle modifications including dieting and exercise.  - CBC w/Diff/Platelet - Comprehensive Metabolic Panel (CMET) - Lipid Profile - HgB A1c     Margaretann LovelessJennifer M Burnette, PA-C  [facility]

## 2019-07-09 NOTE — Patient Instructions (Signed)
Carbohydrate Counting for Diabetes Mellitus, Adult  Carbohydrate counting is a method of keeping track of how many carbohydrates you eat. Eating carbohydrates naturally increases the amount of sugar (glucose) in the blood. Counting how many carbohydrates you eat helps keep your blood glucose within normal limits, which helps you manage your diabetes (diabetes mellitus). It is important to know how many carbohydrates you can safely have in each meal. This is different for every person. A diet and nutrition specialist (registered dietitian) can help you make a meal plan and calculate how many carbohydrates you should have at each meal and snack. Carbohydrates are found in the following foods:  Grains, such as breads and cereals.  Dried beans and soy products.  Starchy vegetables, such as potatoes, peas, and corn.  Fruit and fruit juices.  Milk and yogurt.  Sweets and snack foods, such as cake, cookies, candy, chips, and soft drinks. How do I count carbohydrates? There are two ways to count carbohydrates in food. You can use either of the methods or a combination of both. Reading "Nutrition Facts" on packaged food The "Nutrition Facts" list is included on the labels of almost all packaged foods and beverages in the [place] It includes:  The serving size.  Information about nutrients in each serving, including the grams (g) of carbohydrate per serving. To use the "Nutrition Facts":  Decide how many servings you will have.  Multiply the number of servings by the number of carbohydrates per serving.  The resulting number is the total amount of carbohydrates that you will be having. Learning standard serving sizes of other foods When you eat carbohydrate foods that are not packaged or do not include "Nutrition Facts" on the label, you need to measure the servings in order to count the amount of carbohydrates:  Measure the foods that you will eat with a food scale or measuring cup, if needed.   Decide how many standard-size servings you will eat.  Multiply the number of servings by 15. Most carbohydrate-rich foods have about 15 g of carbohydrates per serving. ? For example, if you eat 8 oz (170 g) of strawberries, you will have eaten 2 servings and 30 g of carbohydrates (2 servings x 15 g = 30 g).  For foods that have more than one food mixed, such as soups and casseroles, you must count the carbohydrates in each food that is included. The following list contains standard serving sizes of common carbohydrate-rich foods. Each of these servings has about 15 g of carbohydrates:   hamburger bun or  English muffin.   oz (15 mL) syrup.   oz (14 g) jelly.  1 slice of bread.  1 six-inch tortilla.  3 oz (85 g) cooked rice or pasta.  4 oz (113 g) cooked dried beans.  4 oz (113 g) starchy vegetable, such as peas, corn, or potatoes.  4 oz (113 g) hot cereal.  4 oz (113 g) mashed potatoes or  of a large baked potato.  4 oz (113 g) canned or frozen fruit.  4 oz (120 mL) fruit juice.  4-6 crackers.  6 chicken nuggets.  6 oz (170 g) unsweetened dry cereal.  6 oz (170 g) plain fat-free yogurt or yogurt sweetened with artificial sweeteners.  8 oz (240 mL) milk.  8 oz (170 g) fresh fruit or one small piece of fruit.  24 oz (680 g) popped popcorn. Example of carbohydrate counting Sample meal  3 oz (85 g) chicken breast.  6 oz (170 g)   brown rice.  4 oz (113 g) corn.  8 oz (240 mL) milk.  8 oz (170 g) strawberries with sugar-free whipped topping. Carbohydrate calculation 1. Identify the foods that contain carbohydrates: ? Rice. ? Corn. ? Milk. ? Strawberries. 2. Calculate how many servings you have of each food: ? 2 servings rice. ? 1 serving corn. ? 1 serving milk. ? 1 serving strawberries. 3. Multiply each number of servings by 15 g: ? 2 servings rice x 15 g = 30 g. ? 1 serving corn x 15 g = 15 g. ? 1 serving milk x 15 g = 15 g. ? 1 serving  strawberries x 15 g = 15 g. 4. Add together all of the amounts to find the total grams of carbohydrates eaten: ? 30 g + 15 g + 15 g + 15 g = 75 g of carbohydrates total. Summary  Carbohydrate counting is a method of keeping track of how many carbohydrates you eat.  Eating carbohydrates naturally increases the amount of sugar (glucose) in the blood.  Counting how many carbohydrates you eat helps keep your blood glucose within normal limits, which helps you manage your diabetes.  A diet and nutrition specialist (registered dietitian) can help you make a meal plan and calculate how many carbohydrates you should have at each meal and snack. This information is not intended to replace advice given to you by your health care provider. Make sure you discuss any questions you have with your health care provider. Document Released: 11/29/2005 Document Revised: 06/23/2017 Document Reviewed: 05/12/2016 Elsevier Patient Education  2020 [employer]  

## 2019-07-13 ENCOUNTER — Telehealth: Payer: Self-pay

## 2019-07-13 LAB — CBC WITH DIFFERENTIAL/PLATELET
Basophils Absolute: 0 10*3/uL (ref 0.0–0.2)
Basos: 0 %
EOS (ABSOLUTE): 0.1 10*3/uL (ref 0.0–0.4)
Eos: 1 %
Hematocrit: 33.6 % — ABNORMAL LOW (ref 34.0–46.6)
Hemoglobin: 10.8 g/dL — ABNORMAL LOW (ref 11.1–15.9)
Immature Grans (Abs): 0 10*3/uL (ref 0.0–0.1)
Immature Granulocytes: 0 %
Lymphocytes Absolute: 2 10*3/uL (ref 0.7–3.1)
Lymphs: 23 %
MCH: 28.3 pg (ref 26.6–33.0)
MCHC: 32.1 g/dL (ref 31.5–35.7)
MCV: 88 fL (ref 79–97)
Monocytes Absolute: 0.6 10*3/uL (ref 0.1–0.9)
Monocytes: 6 %
Neutrophils Absolute: 6.1 10*3/uL (ref 1.4–7.0)
Neutrophils: 70 %
Platelets: 394 10*3/uL (ref 150–450)
RBC: 3.82 x10E6/uL (ref 3.77–5.28)
RDW: 13.2 % (ref 11.7–15.4)
WBC: 8.8 10*3/uL (ref 3.4–10.8)

## 2019-07-13 LAB — COMPREHENSIVE METABOLIC PANEL
ALT: 17 IU/L (ref 0–32)
AST: 12 IU/L (ref 0–40)
Albumin/Globulin Ratio: 1.4 (ref 1.2–2.2)
Albumin: 4.1 g/dL (ref 3.8–4.8)
Alkaline Phosphatase: 66 IU/L (ref 39–117)
BUN/Creatinine Ratio: 12 (ref 9–23)
BUN: 10 mg/dL (ref 6–24)
Bilirubin Total: 0.2 mg/dL (ref 0.0–1.2)
CO2: 21 mmol/L (ref 20–29)
Calcium: 9.1 mg/dL (ref 8.7–10.2)
Chloride: 101 mmol/L (ref 96–106)
Creatinine, Ser: 0.85 mg/dL (ref 0.57–1.00)
GFR calc Af Amer: 93 mL/min/{1.73_m2} (ref >59)
GFR calc non Af Amer: 81 mL/min/{1.73_m2} (ref >59)
Globulin, Total: 2.9 g/dL (ref 1.5–4.5)
Glucose: 224 mg/dL — ABNORMAL HIGH (ref 65–99)
Potassium: 4.1 mmol/L (ref 3.5–5.2)
Sodium: 141 mmol/L (ref 134–144)
Total Protein: 7 g/dL (ref 6.0–8.5)

## 2019-07-13 LAB — LIPID PANEL
Chol/HDL Ratio: 2.4 ratio (ref 0.0–4.4)
Cholesterol, Total: 150 mg/dL (ref 100–199)
HDL: 63 mg/dL (ref >39)
LDL Calculated: 71 mg/dL (ref 0–99)
Triglycerides: 79 mg/dL (ref 0–149)
VLDL Cholesterol Cal: 16 mg/dL (ref 5–40)

## 2019-07-13 LAB — HEMOGLOBIN A1C
Est. average glucose Bld gHb Est-mCnc: 194 mg/dL
Hgb A1c MFr Bld: 8.4 % — ABNORMAL HIGH (ref 4.8–5.6)

## 2019-07-13 NOTE — Telephone Encounter (Signed)
LMTCB

## 2019-07-13 NOTE — Telephone Encounter (Signed)
-----   Message from Mar Daring, PA-C sent at 07/13/2019  8:28 AM EDT ----- Hemoglobin is holding steady at 10.8. Continue iron supplements to help. Kidney and liver function are normal. Sodium, potassium and calcium are normal. Cholesterol is much improved. A1c did read at 8.4. Restart Ozempic as discussed in the office.

## 2019-07-13 NOTE — Telephone Encounter (Signed)
Viewed by Cinda Quest on 07/13/2019 10:47 AM Written by Mar Daring, PA-C on 07/13/2019 8:28 AM Hemoglobin is holding steady at 10.8. Continue iron supplements to help. Kidney and liver function are normal. Sodium, potassium and calcium are normal. Cholesterol is much improved. A1c did read at 8.4. Restart Ozempic as discussed in the office

## 2019-07-16 ENCOUNTER — Encounter: Payer: Self-pay | Admitting: Physician Assistant

## 2019-07-16 DIAGNOSIS — E1129 Type 2 diabetes mellitus with other diabetic kidney complication: Secondary | ICD-10-CM

## 2019-07-17 MED ORDER — OZEMPIC (0.25 OR 0.5 MG/DOSE) 2 MG/1.5ML ~~LOC~~ SOPN: 0.5000 mg | PEN_INJECTOR | SUBCUTANEOUS | 5 refills | Status: DC

## 2019-07-17 MED ORDER — BD PEN NEEDLE NANO 2ND GEN 32G X 4 MM MISC: 3 refills | Status: DC

## 2019-07-17 NOTE — Telephone Encounter (Signed)
Please call to schedule a 3 month t2dm f/u

## 2019-07-20 ENCOUNTER — Telehealth: Payer: Self-pay

## 2019-07-20 NOTE — Telephone Encounter (Signed)
LMTCB-if patient calls back please schedule her a 3 month f/u T2DM

## 2019-07-20 NOTE — Telephone Encounter (Signed)
Patient called back to schedule follow up

## 2019-08-13 ENCOUNTER — Encounter: Payer: Self-pay | Admitting: Physician Assistant

## 2019-08-22 ENCOUNTER — Other Ambulatory Visit: Payer: Self-pay | Admitting: Physician Assistant

## 2019-08-22 DIAGNOSIS — E119 Type 2 diabetes mellitus without complications: Secondary | ICD-10-CM

## 2019-08-24 ENCOUNTER — Encounter: Payer: Self-pay | Admitting: Physician Assistant

## 2019-09-28 ENCOUNTER — Other Ambulatory Visit: Payer: Self-pay | Admitting: Physician Assistant

## 2019-09-28 DIAGNOSIS — I10 Essential (primary) hypertension: Secondary | ICD-10-CM

## 2019-09-28 NOTE — Telephone Encounter (Signed)
Notified patient that her script for Ramipril has been sent to requested pharmacy. Patient gave verbal understanding.

## 2019-10-09 NOTE — Progress Notes (Signed)
I, Carolyn Nolan ,CMA am acting as a Education administrator for [employer] PA-C    Patient: Carolyn Nolan Female    DOB: 09-23-1971   48 y.o.   MRN: [id number] Visit Date: 10/10/2019  Today's Provider: Mar Daring, PA-C   Chief Complaint  Patient presents with  . Diabetes   Subjective:     HPI   Diabetes Mellitus Type II, Follow-up:   Lab Results  Component Value Date   HGBA1C 6.9 (A) 10/10/2019   HGBA1C 8.4 (H) 07/12/2019   HGBA1C 8.9 (A) 07/09/2019    Last seen for diabetes 2 months ago.  Management since then includes restart Ozempic for patient. Continue all other medications as well. She reports good compliance with treatment. She is not having side effects.  Current symptoms include none and have been stable. Home blood sugar records: Not testing at home  Episodes of hypoglycemia? no   Current insulin regiment: Is not on insulin Most Recent Eye Exam: 10/15/2019 Weight trend: stable Prior visit with dietician: No Current exercise: walking Current diet habits: well balanced  Pertinent Labs:    Component Value Date/Time   CHOL 150 07/12/2019 1116   TRIG 79 07/12/2019 1116   HDL 63 07/12/2019 1116   LDLCALC 71 07/12/2019 1116   CREATININE 0.85 07/12/2019 1116    Wt Readings from Last 3 Encounters:  10/10/19 249 lb (112.9 kg)  07/09/19 256 lb 12.8 oz (116.5 kg)  01/08/19 272 lb (123.4 kg)   ------------------------------------------------------------------------  No Known Allergies   Current Outpatient Medications:  .  aspirin 81 MG tablet, BAYER LOW STRENGTH, 81MG (Oral Tablet Delayed Release)  1 Every Day for 0 days  Quantity: 0.00;  Refills: 0   Ordered :05-Oct-2010  Edmonia James ;  Started 09-May-2007 Active Comments: DX: 250.00, Disp: , Rfl:  .  benzoyl peroxide-erythromycin (BENZAMYCIN) gel, APP TO FACE QAM UTD, Disp: , Rfl:  .  Calcium Carbonate-Vitamin D 600-200 MG-UNIT CAPS, CALCIUM + D, 600-200MG-UNIT (Oral Tablet)  1 Twice Daily  for 0 days  Quantity: 0.00;  Refills: 0   Ordered :05-Oct-2010  Ashley Royalty ;  Started 03-July-2010 Active, Disp: , Rfl:  .  Cetirizine HCl 10 MG CAPS, Take by mouth., Disp: , Rfl:  .  cyclobenzaprine (FLEXERIL) 5 MG tablet, TAKE 1 TABLET BY MOUTH AT BEDTIME AS NEEDED FOR MUSCLE SPASMS, Disp: 30 tablet, Rfl: 3 .  ferrous sulfate 325 (65 FE) MG tablet, Take by mouth., Disp: , Rfl:  .  IRON-VITAMINS PO, Take by mouth., Disp: , Rfl:  .  JANUVIA 100 MG tablet, TAKE 1 TABLET BY MOUTH EVERY DAY, Disp: 90 tablet, Rfl: 1 .  metFORMIN (GLUCOPHAGE) 1000 MG tablet, TAKE 1 TABLET BY MOUTH TWICE DAILY WITH FOOD, Disp: 180 tablet, Rfl: 1 .  montelukast (SINGULAIR) 10 MG tablet, take 1 tablet by mouth at bedtime, Disp: 90 tablet, Rfl: 1 .  MULTIPLE VITAMIN PO, MULTIVITAMINS (Oral Tablet)  1 Every Day for 0 days  Quantity: 0.00;  Refills: 0   Ordered :05-Oct-2010  Ashley Royalty ;  Started 18-Mar-2010 Active Comments: DX: 250.00, Disp: , Rfl:  .  potassium gluconate (RA POTASSIUM GLUCONATE) 595 (99 K) MG TABS tablet, Take by mouth., Disp: , Rfl:  .  ramipril (ALTACE) 2.5 MG capsule, TAKE 1 CAPSULE BY MOUTH ONCE DAILY., Disp: 90 capsule, Rfl: 1 .  Semaglutide,0.25 or 0.5MG/DOS, (OZEMPIC, 0.25 OR 0.5 MG/DOSE,) 2 MG/1.5ML SOPN, Inject 0.5 mg into the skin once a week., Disp: 4 pen,  Rfl: 5 .  simvastatin (ZOCOR) 10 MG tablet, Take 1 tablet (10 mg total) by mouth at bedtime., Disp: 90 tablet, Rfl: 3 .  Tretinoin Microsphere (RETIN-A MICRO PUMP) 0.08 % GEL, Apply topically at bedtime., Disp: , Rfl:  .  triamterene-hydrochlorothiazide (MAXZIDE-25) 37.5-25 MG tablet, TAKE 1 TABLET BY MOUTH ONCE DAILY, Disp: 90 tablet, Rfl: 1 .  azelastine (ASTELIN) 0.1 % nasal spray, instill 1 spray into each nostril twice a day, Disp: , Rfl: 0  Review of Systems  Constitutional: Negative.   Respiratory: Negative.   Cardiovascular: Negative.   Gastrointestinal: Negative.   Endocrine: Negative for polydipsia, polyphagia and polyuria.   Neurological: Negative for numbness.    Social History   Tobacco Use  . Smoking status: Never Smoker  . Smokeless tobacco: Never Used  Substance Use Topics  . Alcohol use: Yes    Comment: Occasional Wine      Objective:   BP 140/78   Pulse 92   Temp (!) 97.2 F (36.2 C) (Temporal)   Resp 18   Ht _0  (1.626 m)   Wt 249 lb (112.9 kg)   SpO2 99%   BMI 42.74 kg/m  Vitals:   10/10/19 1604  BP: 140/78  Pulse: 92  Resp: 18  Temp: (!) 97.2 F (36.2 C)  TempSrc: Temporal  SpO2: 99%  Weight: 249 lb (112.9 kg)  Height: _1  (1.626 m)  Body mass index is 42.74 kg/m.   Physical Exam Vitals signs reviewed.  Constitutional:      General: She is not in acute distress.    Appearance: Normal appearance. She is well-developed. She is obese. She is not ill-appearing or diaphoretic.  Neck:     Musculoskeletal: Normal range of motion and neck supple.     Thyroid: No thyromegaly.     Vascular: No JVD.     Trachea: No tracheal deviation.  Cardiovascular:     Rate and Rhythm: Normal rate and regular rhythm.     Heart sounds: Normal heart sounds. No murmur. No friction rub. No gallop.   Pulmonary:     Effort: Pulmonary effort is normal. No respiratory distress.     Breath sounds: Normal breath sounds. No wheezing or rales.  Lymphadenopathy:     Cervical: No cervical adenopathy.  Neurological:     Mental Status: She is alert.      Results for orders placed or performed in visit on 10/10/19  POCT glycosylated hemoglobin (Hb A1C)  Result Value Ref Range   Hemoglobin A1C 6.9 (A) 4.0 - 5.6 %   Est. average glucose Bld gHb Est-mCnc 151        Assessment & Plan    1. Class 3 severe obesity due to excess calories with serious comorbidity and body mass index (BMI) of 40.0 to 44.9 in adult [facility]) Counseled patient on healthy lifestyle modifications including dieting and exercise. Has lost from 272 pounds to 249 pounds since January 2020.   2. Type 2 diabetes mellitus with  other specified complication, without long-term current use of insulin (HCC) Improving. A1c down to 6.9 from 8.4. Continue Ozempic 0.39m weekly, metformin 10031mBID, Januvia 10026m  3. Chronic cluster headache, not intractable Had thought this was side effect to Ozempic but persisted even after discontinuing. Will check labs as below and f/u pending results. Call if worsening.  - CBC w/Diff/Platelet - Sed Rate (ESR) - C-reactive protein  Other orders - ferrous sulfate 325 (65 FE) MG tablet; Take by mouth. -  benzoyl peroxide-erythromycin (BENZAMYCIN) gel; APP TO FACE QAM UTD - potassium gluconate (RA POTASSIUM GLUCONATE) 595 (99 K) MG TABS tablet; Take by mouth.    Mar Daring, PA-C  [facility]

## 2019-10-10 ENCOUNTER — Other Ambulatory Visit: Payer: Self-pay

## 2019-10-10 ENCOUNTER — Ambulatory Visit: Payer: Managed Care, Other (non HMO) | Admitting: Physician Assistant

## 2019-10-10 ENCOUNTER — Encounter: Payer: Self-pay | Admitting: Physician Assistant

## 2019-10-10 VITALS — BP 140/78 | HR 92 | Temp 97.2°F | Resp 18 | Ht 64.0 in | Wt 249.0 lb

## 2019-10-10 DIAGNOSIS — Z6841 Body Mass Index (BMI) 40.0 and over, adult: Secondary | ICD-10-CM

## 2019-10-10 DIAGNOSIS — G44029 Chronic cluster headache, not intractable: Secondary | ICD-10-CM

## 2019-10-10 DIAGNOSIS — E1169 Type 2 diabetes mellitus with other specified complication: Secondary | ICD-10-CM

## 2019-10-10 LAB — POCT GLYCOSYLATED HEMOGLOBIN (HGB A1C)
Est. average glucose Bld gHb Est-mCnc: 151
Hemoglobin A1C: 6.9 % — AB (ref 4.0–5.6)

## 2019-10-10 NOTE — Patient Instructions (Signed)
Cluster Headache °A cluster headache is a type of headache that causes deep, intense head pain. Cluster headaches can last from 15 minutes to 3 hours. They usually occur: °· On one side of the head. They may occur on the other side when a new cluster of headaches begins. °· Repeatedly over weeks to months. °· Several times a day. °· At the same time of day, often at night. °· More often in the fall and springtime. °What are the causes? °The cause of this condition is not known. °What increases the risk? °This condition is more likely to develop in: °· Males. °· People who drink alcohol. °· People who smoke or use products that contain nicotine or tobacco. °· People who take medicines that cause blood vessels to expand, such as nitroglycerin. °· People who take antihistamines. °What are the signs or symptoms? °Symptoms of this condition include: °· Severe pain on one side of the head that begins behind or around your eye or temple. °· Pain on one side of the head. °· Nausea. °· Sensitivity to light. °· Runny nose and nasal stuffiness. °· Sweaty, pale skin on the face. °· Droopy or swollen eyelid, eye redness, or tearing. °· Restlessness and agitation. °How is this diagnosed? °This condition may be diagnosed based on: °· Your symptoms. °· A physical exam. °Your health care provider may order tests to see if your headaches are caused by another medical condition. These tests may show that you do not have cluster headaches. Tests may include: °· A CT scan of your head. °· An MRI of your head. °· Lab tests. °How is this treated? °This condition may be treated with: °· Medicines to relieve pain and to prevent repeated (recurrent) attacks. Some people may need a combination of medicines. °· Oxygen. This helps to relieve pain. °Follow these instructions at home: °Headache diary °Keep a headache diary as told by your health care provider. Doing this can help you and your health care provider figure out what triggers your  headaches. In your headache diary, include information about: °· The time of day that your headache started and what you were doing when it began. °· How long your headache lasted. °· Where your pain started and whether it moved to other areas. °· The type of pain, such as burning, stabbing, throbbing, or cramping. °· Your level of pain. Use a pain scale and rate the pain with a number from 1 (mild) up to 10 (severe). °· The treatment that you used, and any change in symptoms after treatment. ° °Medicines °· Take over-the-counter and prescription medicines only as told by your health care provider. °· Do not drive or use heavy machinery while taking prescription pain medicine. °· Use oxygen as told by your health care provider. °Lifestyle °· Follow a regular sleep schedule. Do not vary the time that you go to bed or the amount that you sleep from day to day. It is important to stay on the same schedule during a cluster period to help prevent headaches. °· Exercise regularly. °· Eat a healthy diet and avoid foods that may trigger your headaches. °· Avoid alcohol. °· Do not use any products that contain nicotine or tobacco, such as cigarettes and e-cigarettes. If you need help quitting, ask your health care provider. °Contact a health care provider if: °· Your headaches change, become more severe, or occur more often. °· The medicine or oxygen that your health care provider recommended does not help. °Get help right away   if: °· You faint. °· You have weakness or numbness, especially on one side of your body or face. °· You have double vision. °· You have nausea or vomiting that does not go away within several hours. °· You have trouble talking, walking, or keeping your balance. °· You have pain or stiffness in your neck. °· You have a fever. °Summary °· A cluster headache is a type of headache that causes deep, intense head pain, usually on one side of the head. °· Keep a headache diary to help discover what triggers  your headaches. °· A regular sleep schedule can help prevent headaches. °This information is not intended to replace advice given to you by your health care provider. Make sure you discuss any questions you have with your health care provider. °Document Released: 11/29/2005 Document Revised: 12/14/2017 Document Reviewed: 08/10/2016 °Elsevier Patient Education © 2020 [employer] ° °

## 2019-10-15 LAB — HM DIABETES EYE EXAM

## 2019-10-16 LAB — CBC WITH DIFFERENTIAL/PLATELET
Basophils Absolute: 0 10*3/uL (ref 0.0–0.2)
Basos: 0 %
EOS (ABSOLUTE): 0.1 10*3/uL (ref 0.0–0.4)
Eos: 1 %
Hematocrit: 36.5 % (ref 34.0–46.6)
Hemoglobin: 11.3 g/dL (ref 11.1–15.9)
Immature Grans (Abs): 0 10*3/uL (ref 0.0–0.1)
Immature Granulocytes: 0 %
Lymphocytes Absolute: 3 10*3/uL (ref 0.7–3.1)
Lymphs: 30 %
MCH: 27 pg (ref 26.6–33.0)
MCHC: 31 g/dL — ABNORMAL LOW (ref 31.5–35.7)
MCV: 87 fL (ref 79–97)
Monocytes Absolute: 0.7 10*3/uL (ref 0.1–0.9)
Monocytes: 7 %
Neutrophils Absolute: 6 10*3/uL (ref 1.4–7.0)
Neutrophils: 62 %
Platelets: 369 10*3/uL (ref 150–450)
RBC: 4.19 x10E6/uL (ref 3.77–5.28)
RDW: 12.8 % (ref 11.7–15.4)
WBC: 9.8 10*3/uL (ref 3.4–10.8)

## 2019-10-16 LAB — SEDIMENTATION RATE: Sed Rate: 48 mm/hr — ABNORMAL HIGH (ref 0–32)

## 2019-10-16 LAB — C-REACTIVE PROTEIN: CRP: 1 mg/L (ref 0–10)

## 2019-10-22 ENCOUNTER — Encounter: Payer: Self-pay | Admitting: Physician Assistant

## 2019-11-12 ENCOUNTER — Encounter: Payer: Self-pay | Admitting: Physician Assistant

## 2019-11-12 DIAGNOSIS — G44029 Chronic cluster headache, not intractable: Secondary | ICD-10-CM

## 2019-11-12 NOTE — Addendum Note (Signed)
Addended by: Doristine Devoid on: 11/12/2019 02:23 PM   Modules accepted: Orders

## 2019-11-18 ENCOUNTER — Other Ambulatory Visit: Payer: Self-pay | Admitting: Physician Assistant

## 2019-11-18 DIAGNOSIS — I10 Essential (primary) hypertension: Secondary | ICD-10-CM

## 2019-11-19 DIAGNOSIS — R519 Headache, unspecified: Secondary | ICD-10-CM | POA: Insufficient documentation

## 2020-01-10 NOTE — Progress Notes (Signed)
Patient: Carolyn Nolan, Female    DOB: Jan 09, 1971, 49 y.o.   MRN: [id number] Visit Date: 01/14/2020  Today's Provider: Margaretann Loveless, PA-C   Chief Complaint  Patient presents with  . Annual Exam   Subjective:     Annual physical exam Carolyn Nolan is a 49 y.o. female who presents today for health maintenance and complete physical. She feels well. She reports exercising daily 5000 steps. She reports she is sleeping well.  ----------------------------------------------------------------- 01/31/2017-Pap is normal, HPV negative. Will repeat in 5 years 02/02/19-Mammogram-Normal mammogram. Repeat screening in one year  Review of Systems  Constitutional: Negative.   HENT: Positive for sinus pressure.   Eyes: Negative.   Respiratory: Negative.   Cardiovascular: Negative.   Gastrointestinal: Negative.   Endocrine: Negative.   Genitourinary: Negative.   Musculoskeletal: Negative.   Skin: Negative.   Allergic/Immunologic: Positive for environmental allergies.  Neurological: Positive for headaches.  Hematological: Negative.   Psychiatric/Behavioral: Negative.     Social History      She  reports that she has never smoked. She has never used smokeless tobacco. She reports current alcohol use. She reports that she does not use drugs.       Social History   Socioeconomic History  . Marital status: Single    Spouse name: Not on file  . Number of children: Not on file  . Years of education: Not on file  . Highest education level: Not on file  Occupational History    Employer: [employer]  Tobacco Use  . Smoking status: Never Smoker  . Smokeless tobacco: Never Used  Substance and Sexual Activity  . Alcohol use: Yes    Comment: Occasional Wine  . Drug use: No  . Sexual activity: Never  Other Topics Concern  . Not on file  Social History Narrative  . Not on file   Social Determinants of Health   Financial Resource Strain:   . Difficulty of  Paying Living Expenses: Not on file  Food Insecurity:   . Worried About Programme researcher, broadcasting/film/video in the Last Year: Not on file  . Ran Out of Food in the Last Year: Not on file  Transportation Needs:   . Lack of Transportation (Medical): Not on file  . Lack of Transportation (Non-Medical): Not on file  Physical Activity:   . Days of Exercise per Week: Not on file  . Minutes of Exercise per Session: Not on file  Stress:   . Feeling of Stress : Not on file  Social Connections:   . Frequency of Communication with Friends and Family: Not on file  . Frequency of Social Gatherings with Friends and Family: Not on file  . Attends Religious Services: Not on file  . Active Member of Clubs or Organizations: Not on file  . Attends Banker Meetings: Not on file  . Marital Status: Not on file    No past medical history on file.   Patient Active Problem List   Diagnosis Date Noted  . Hypercholesterolemia 01/10/2019  . Hyperlipidemia associated with type 2 diabetes mellitus (HCC) 01/08/2019  . Obese 01/08/2019  . Costalchondritis 10/06/2015  . Allergic rhinitis 04/29/2015  . Adult BMI 30+ 04/29/2015  . History of DVT (deep vein thrombosis) 04/29/2015  . Burning or prickling sensation 04/29/2015  . Anemia, iron deficiency 06/24/2009  . Diabetes mellitus, type 2 (HCC) 08/19/2000  . BP (high blood pressure) 02/07/1996    Past Surgical History:  Procedure Laterality Date  . BREAST BIOPSY Left 2019   Keloid biopsy,benign   . NO PAST SURGERIES      Family History        Family Status  Relation Name Status  . Mother  Alive  . Father  Deceased  . Sister  Alive  . Neg Hx  (Not Specified)        Her family history includes Diabetes in her father and mother; Healthy in her sister; Hypertension in her father. There is no history of Breast cancer.      No Known Allergies   Current Outpatient Medications:  .  Ascorbic Acid (VITAMIN C) 1000 MG tablet, Take 1,000 mg by mouth  daily., Disp: , Rfl:  .  aspirin 81 MG tablet, BAYER LOW STRENGTH, 81MG  (Oral Tablet Delayed Release)  1 Every Day for 0 days  Quantity: 0.00;  Refills: 0   Ordered :05-Oct-2010  Edmonia James ;  Started 09-May-2007 Active Comments: DX: 250.00, Disp: , Rfl:  .  azelastine (ASTELIN) 0.1 % nasal spray, instill 1 spray into each nostril twice a day, Disp: , Rfl: 0 .  benzoyl peroxide-erythromycin (BENZAMYCIN) gel, APP TO FACE QAM UTD, Disp: , Rfl:  .  Calcium Carbonate-Vitamin D 600-200 MG-UNIT CAPS, CALCIUM + D, 600-200MG -UNIT (Oral Tablet)  1 Twice Daily for 0 days  Quantity: 0.00;  Refills: 0   Ordered :05-Oct-2010  Ashley Royalty ;  Started 03-July-2010 Active, Disp: , Rfl:  .  Cetirizine HCl 10 MG CAPS, Take by mouth., Disp: , Rfl:  .  cyclobenzaprine (FLEXERIL) 5 MG tablet, TAKE 1 TABLET BY MOUTH AT BEDTIME AS NEEDED FOR MUSCLE SPASMS, Disp: 30 tablet, Rfl: 3 .  ferrous sulfate 325 (65 FE) MG tablet, Take by mouth., Disp: , Rfl:  .  IRON-VITAMINS PO, Take by mouth., Disp: , Rfl:  .  JANUVIA 100 MG tablet, TAKE 1 TABLET BY MOUTH EVERY DAY, Disp: 90 tablet, Rfl: 1 .  metFORMIN (GLUCOPHAGE) 1000 MG tablet, TAKE 1 TABLET BY MOUTH TWICE DAILY WITH FOOD, Disp: 180 tablet, Rfl: 1 .  montelukast (SINGULAIR) 10 MG tablet, take 1 tablet by mouth at bedtime, Disp: 90 tablet, Rfl: 1 .  MULTIPLE VITAMIN PO, MULTIVITAMINS (Oral Tablet)  1 Every Day for 0 days  Quantity: 0.00;  Refills: 0   Ordered :05-Oct-2010  Ashley Royalty ;  Started 18-Mar-2010 Active Comments: DX: 250.00, Disp: , Rfl:  .  nortriptyline (PAMELOR) 25 MG capsule, Take by mouth., Disp: , Rfl:  .  potassium gluconate (RA POTASSIUM GLUCONATE) 595 (99 K) MG TABS tablet, Take by mouth., Disp: , Rfl:  .  ramipril (ALTACE) 2.5 MG capsule, TAKE 1 CAPSULE BY MOUTH ONCE DAILY., Disp: 90 capsule, Rfl: 1 .  Semaglutide,0.25 or 0.5MG /DOS, (OZEMPIC, 0.25 OR 0.5 MG/DOSE,) 2 MG/1.5ML SOPN, Inject 0.5 mg into the skin once a week., Disp: 4 pen, Rfl: 5 .   simvastatin (ZOCOR) 10 MG tablet, Take 1 tablet (10 mg total) by mouth at bedtime., Disp: 90 tablet, Rfl: 3 .  Tretinoin Microsphere (RETIN-A MICRO PUMP) 0.08 % GEL, Apply topically at bedtime., Disp: , Rfl:  .  triamterene-hydrochlorothiazide (MAXZIDE-25) 37.5-25 MG tablet, TAKE 1 TABLET BY MOUTH EVERY DAY, Disp: 90 tablet, Rfl: 1 .  vitamin B-12 (CYANOCOBALAMIN) 1000 MCG tablet, Take 1,000 mcg by mouth daily., Disp: , Rfl:  .  Zinc 30 MG TABS, Take by mouth., Disp: , Rfl:    Patient Care Team: Rubye Beach as PCP - General (Family  Medicine)    Objective:    Vitals: BP 137/88 (BP Location: Left Arm, Patient Position: Sitting, Cuff Size: Large)   Pulse (!) 109   Temp (!) 96.8 F (36 C) (Temporal)   Resp 16   Ht 5\' 4"  (1.626 m)   Wt 246 lb 9.6 oz (111.9 kg)   BMI 42.33 kg/m    Vitals:   01/14/20 1006 01/14/20 1009  BP: (!) 143/103 137/88  Pulse: (!) 101 (!) 109  Resp: 16   Temp: (!) 96.8 F (36 C)   TempSrc: Temporal   Weight: 246 lb 9.6 oz (111.9 kg)   Height: 5\' 4"  (1.626 m)      Physical Exam Vitals reviewed.  Constitutional:      General: She is not in acute distress.    Appearance: Normal appearance. She is well-developed. She is obese. She is not ill-appearing or diaphoretic.  HENT:     Head: Normocephalic and atraumatic.     Right Ear: Tympanic membrane, ear canal and external ear normal.     Left Ear: Tympanic membrane, ear canal and external ear normal.  Eyes:     General: No scleral icterus.       Right eye: No discharge.        Left eye: No discharge.     Extraocular Movements: Extraocular movements intact.     Conjunctiva/sclera: Conjunctivae normal.     Pupils: Pupils are equal, round, and reactive to light.  Neck:     Thyroid: No thyromegaly.     Vascular: No JVD.     Trachea: No tracheal deviation.  Cardiovascular:     Rate and Rhythm: Normal rate and regular rhythm.     Pulses: Normal pulses.     Heart sounds: Normal heart sounds.  No murmur. No friction rub. No gallop.   Pulmonary:     Effort: Pulmonary effort is normal. No respiratory distress.     Breath sounds: Normal breath sounds. No wheezing or rales.  Chest:     Chest wall: No tenderness.     Breasts:        Right: Normal. No inverted nipple, mass, skin change or tenderness.        Left: Normal. No inverted nipple, mass, skin change or tenderness.  Abdominal:     General: Abdomen is flat. Bowel sounds are normal. There is no distension.     Palpations: Abdomen is soft. There is no mass.     Tenderness: There is no abdominal tenderness. There is no guarding or rebound.  Musculoskeletal:        General: No tenderness. Normal range of motion.     Cervical back: Normal range of motion and neck supple.     Right lower leg: No edema.     Left lower leg: No edema.  Lymphadenopathy:     Cervical: No cervical adenopathy.     Upper Body:     Right upper body: No supraclavicular, axillary or pectoral adenopathy.     Left upper body: No supraclavicular, axillary or pectoral adenopathy.  Skin:    General: Skin is warm and dry.     Capillary Refill: Capillary refill takes less than 2 seconds.     Findings: No rash.  Neurological:     General: No focal deficit present.     Mental Status: She is alert and oriented to person, place, and time. Mental status is at baseline.  Psychiatric:        Mood and Affect:  Mood normal.        Behavior: Behavior normal.        Thought Content: Thought content normal.        Judgment: Judgment normal.      Depression Screen PHQ 2/9 Scores 01/14/2020 01/08/2019 01/05/2018 07/04/2017  PHQ - 2 Score 1 3 1 2   PHQ- 9 Score 3 9 2 4        Assessment & Plan:     Routine Health Maintenance and Physical Exam  Exercise Activities and Dietary recommendations Goals    . Exercise 150 minutes per week (moderate activity)       Immunization History  Administered Date(s) Administered  . Influenza-Unspecified 10/14/2015,  10/04/2017, 09/14/2018, 08/29/2019  . Pneumococcal Polysaccharide-23 10/15/2011  . Td 01/03/2017  . Tdap 03/10/2006    Health Maintenance  Topic Date Due  . FOOT EXAM  01/09/2020  . PAP SMEAR-Modifier  01/28/2020  . HEMOGLOBIN A1C  04/09/2020  . OPHTHALMOLOGY EXAM  10/14/2020  . TETANUS/TDAP  01/03/2027  . INFLUENZA VACCINE  Completed  . PNEUMOCOCCAL POLYSACCHARIDE VACCINE AGE 52-64 HIGH RISK  Completed  . HIV Screening  Completed     Discussed health benefits of physical activity, and encouraged her to engage in regular exercise appropriate for her age and condition.    1. Annual physical exam Normal physical exam today. Will check labs as below and f/u pending lab results. If labs are stable and WNL she will not need to have these rechecked for one year at her next annual physical exam. She is to call the office in the meantime if she has any acute issue, questions or concerns. - CBC with Differential/Platelet - Comprehensive metabolic panel - Hemoglobin A1c - Lipid panel - TSH  2. Encounter for breast cancer screening using non-mammogram modality Breast exam today was normal. There is no family history of breast cancer. She does perform regular self breast exams. Mammogram was ordered as below. Information for [facility] was given to patient so she may schedule her mammogram at her convenience. - [facility] 3D SCREEN BREAST BILATERAL; Future  3. Colon cancer screening Needs this year. Knows that colonoscopies are not being done currently.  - Ambulatory referral to Gastroenterology  4. Essential hypertension Stable. Continue Ramipril 2.5mg  and Maxzide 37.5-25mg . Will check labs as below and f/u pending results. - CBC with Differential/Platelet - Comprehensive metabolic panel - Hemoglobin A1c  5. Type 2 diabetes mellitus with other specified complication, without long-term current use of insulin (HCC) Stable. Continue Januvia 100mg  daily, Metformin 1000mg  BID, and  Ozempic 0.5mg  weekly. Will check labs as below and f/u pending results. - CBC with Differential/Platelet - Comprehensive metabolic panel - Hemoglobin A1c - Pneumococcal polysaccharide vaccine 23-valent greater than or equal to 2yo subcutaneous/IM  6. Hyperlipidemia associated with type 2 diabetes mellitus (HCC) Stable. Continue Simvastatin 10mg  daily. Will check labs as below and f/u pending results. - CBC with Differential/Platelet - Comprehensive metabolic panel - Hemoglobin A1c  7. Class 3 severe obesity due to excess calories with serious comorbidity and body mass index (BMI) of 40.0 to 44.9 in adult [facility]) Counseled patient on healthy lifestyle modifications including dieting and exercise.  - CBC with Differential/Platelet - Comprehensive metabolic panel - Hemoglobin A1c  8. Need for 23-polyvalent pneumococcal polysaccharide vaccine Pneumococcal 23 Vaccine given to patient without complications. Patient sat for 15 minutes after administration and was tolerated well without adverse effects. - Pneumococcal polysaccharide vaccine 23-valent greater than or equal to 2yo subcutaneous/IM  --------------------------------------------------------------------  Mar Daring, PA-C  [facility]

## 2020-01-11 ENCOUNTER — Telehealth: Payer: Self-pay

## 2020-01-11 NOTE — Telephone Encounter (Signed)
Copied from CRM [id number]. Topic: Clinical - COVID Pre-Screen >> Jan 11, 2020  2:15 PM Gwenlyn Fudge wrote: 1. To the best of your knowledge, have you been in close contact with anyone with a confirmed diagnosis of COVID 19?  no  If no - Proceed to next question; If yes - Schedule patient for a virtual visit  2. Have you had any one or more of the following: fever, chills, cough, shortness of breath or any flu-like symptoms?  no  If no - Proceed to next question; If yes - Schedule patient for a virtual visit  3. Have you been diagnosed with or have a previous diagnosis of COVID 19?  no  If no - Proceed to next question; If yes - Schedule patient for a virtual visit  4. I am going to go over a few other symptoms with you. Please let me know if you are experiencing any of the following: no  Ear, nose or throat discomfort  A sore throat  Headache  Muscle pain  Diarrhea  Loss of taste or smell  If no - Continue with scheduling process; If yes - Document in scheduling notes   Thank you for answering these questions. Please know we will ask you these questions or similar questions when you arrive for your appointment and again it's how we are keeping everyone safe. Also, to keep you safe, please use the provided hand sanitizer when you enter the building. Carrolyn Meiers, we are asking everyone in the building to wear a mask because they help [place] prevent the spread of germs.   Do you have a mask of your own, if not, we are happy to provide one for you. The last thing I want to go over with you is the no visitor guidelines. This means no one can attend the appointment with you unless you need physical assistance. I understand this may be different from your past appointments and I know this may be difficult but please know if someone is driving you we are happy to call them for you once your appointment is over.

## 2020-01-14 ENCOUNTER — Other Ambulatory Visit: Payer: Self-pay

## 2020-01-14 ENCOUNTER — Ambulatory Visit (INDEPENDENT_AMBULATORY_CARE_PROVIDER_SITE_OTHER): Payer: Managed Care, Other (non HMO) | Admitting: Physician Assistant

## 2020-01-14 ENCOUNTER — Encounter: Payer: Self-pay | Admitting: Physician Assistant

## 2020-01-14 VITALS — BP 137/88 | HR 109 | Temp 96.8°F | Resp 16 | Ht 64.0 in | Wt 246.6 lb

## 2020-01-14 DIAGNOSIS — Z1239 Encounter for other screening for malignant neoplasm of breast: Secondary | ICD-10-CM | POA: Diagnosis not present

## 2020-01-14 DIAGNOSIS — Z23 Encounter for immunization: Secondary | ICD-10-CM | POA: Diagnosis not present

## 2020-01-14 DIAGNOSIS — E785 Hyperlipidemia, unspecified: Secondary | ICD-10-CM

## 2020-01-14 DIAGNOSIS — Z0000 Encounter for general adult medical examination without abnormal findings: Secondary | ICD-10-CM

## 2020-01-14 DIAGNOSIS — I10 Essential (primary) hypertension: Secondary | ICD-10-CM | POA: Diagnosis not present

## 2020-01-14 DIAGNOSIS — E1169 Type 2 diabetes mellitus with other specified complication: Secondary | ICD-10-CM

## 2020-01-14 DIAGNOSIS — Z1211 Encounter for screening for malignant neoplasm of colon: Secondary | ICD-10-CM

## 2020-01-14 DIAGNOSIS — Z6841 Body Mass Index (BMI) 40.0 and over, adult: Secondary | ICD-10-CM

## 2020-01-14 NOTE — Patient Instructions (Signed)
Health Maintenance, Female Adopting a healthy lifestyle and getting preventive care are important in promoting health and wellness. Ask your health care provider about:  The right schedule for you to have regular tests and exams.  Things you can do on your own to prevent diseases and keep yourself healthy. What should I know about diet, weight, and exercise? Eat a healthy diet   Eat a diet that includes plenty of vegetables, fruits, low-fat dairy products, and lean protein.  Do not eat a lot of foods that are high in solid fats, added sugars, or sodium. Maintain a healthy weight Body mass index (BMI) is used to identify weight problems. It estimates body fat based on height and weight. Your health care provider can help determine your BMI and help you achieve or maintain a healthy weight. Get regular exercise Get regular exercise. This is one of the most important things you can do for your health. Most adults should:  Exercise for at least 150 minutes each week. The exercise should increase your heart rate and make you sweat (moderate-intensity exercise).  Do strengthening exercises at least twice a week. This is in addition to the moderate-intensity exercise.  Spend less time sitting. Even light physical activity can be beneficial. Watch cholesterol and blood lipids Have your blood tested for lipids and cholesterol at 50 years of age, then have this test every 5 years. Have your cholesterol levels checked more often if:  Your lipid or cholesterol levels are high.  You are older than 50 years of age.  You are at high risk for heart disease. What should I know about cancer screening? Depending on your health history and family history, you may need to have cancer screening at various ages. This may include screening for:  Breast cancer.  Cervical cancer.  Colorectal cancer.  Skin cancer.  Lung cancer. What should I know about heart disease, diabetes, and high blood  pressure? Blood pressure and heart disease  High blood pressure causes heart disease and increases the risk of stroke. This is more likely to develop in people who have high blood pressure readings, are of African descent, or are overweight.  Have your blood pressure checked: ? Every 3-5 years if you are 18-39 years of age. ? Every year if you are 40 years old or older. Diabetes Have regular diabetes screenings. This checks your fasting blood sugar level. Have the screening done:  Once every three years after age 40 if you are at a normal weight and have a low risk for diabetes.  More often and at a younger age if you are overweight or have a high risk for diabetes. What should I know about preventing infection? Hepatitis B If you have a higher risk for hepatitis B, you should be screened for this virus. Talk with your health care provider to find out if you are at risk for hepatitis B infection. Hepatitis C Testing is recommended for:  Everyone born from 1945 through 1965.  Anyone with known risk factors for hepatitis C. Sexually transmitted infections (STIs)  Get screened for STIs, including gonorrhea and chlamydia, if: ? You are sexually active and are younger than 50 years of age. ? You are older than 50 years of age and your health care provider tells you that you are at risk for this type of infection. ? Your sexual activity has changed since you were last screened, and you are at increased risk for chlamydia or gonorrhea. Ask your health care provider if   you are at risk.  Ask your health care provider about whether you are at high risk for HIV. Your health care provider may recommend a prescription medicine to help prevent HIV infection. If you choose to take medicine to prevent HIV, you should first get tested for HIV. You should then be tested every 3 months for as long as you are taking the medicine. Pregnancy  If you are about to stop having your period (premenopausal) and  you may become pregnant, seek counseling before you get pregnant.  Take 400 to 800 micrograms (mcg) of folic acid every day if you become pregnant.  Ask for birth control (contraception) if you want to prevent pregnancy. Osteoporosis and menopause Osteoporosis is a disease in which the bones lose minerals and strength with aging. This can result in bone fractures. If you are 65 years old or older, or if you are at risk for osteoporosis and fractures, ask your health care provider if you should:  Be screened for bone loss.  Take a calcium or vitamin D supplement to lower your risk of fractures.  Be given hormone replacement therapy (HRT) to treat symptoms of menopause. Follow these instructions at home: Lifestyle  Do not use any products that contain nicotine or tobacco, such as cigarettes, e-cigarettes, and chewing tobacco. If you need help quitting, ask your health care provider.  Do not use street drugs.  Do not share needles.  Ask your health care provider for help if you need support or information about quitting drugs. Alcohol use  Do not drink alcohol if: ? Your health care provider tells you not to drink. ? You are pregnant, may be pregnant, or are planning to become pregnant.  If you drink alcohol: ? Limit how much you use to 0-1 drink a day. ? Limit intake if you are breastfeeding.  Be aware of how much alcohol is in your drink. In the [place] one drink equals one 12 oz bottle of beer (355 mL), one 5 oz glass of wine (148 mL), or one 1 oz glass of hard liquor (44 mL). General instructions  Schedule regular health, dental, and eye exams.  Stay current with your vaccines.  Tell your health care provider if: ? You often feel depressed. ? You have ever been abused or do not feel safe at home. Summary  Adopting a healthy lifestyle and getting preventive care are important in promoting health and wellness.  Follow your health care provider's instructions about healthy  diet, exercising, and getting tested or screened for diseases.  Follow your health care provider's instructions on monitoring your cholesterol and blood pressure. This information is not intended to replace advice given to you by your health care provider. Make sure you discuss any questions you have with your health care provider. Document Revised: 11/22/2018 Document Reviewed: 11/22/2018 Elsevier Patient Education  2020 Elsevier Inc[employer]  

## 2020-01-15 ENCOUNTER — Encounter: Payer: Self-pay | Admitting: Physician Assistant

## 2020-01-15 ENCOUNTER — Telehealth: Payer: Self-pay

## 2020-01-15 LAB — COMPREHENSIVE METABOLIC PANEL
ALT: 19 IU/L (ref 0–32)
AST: 15 IU/L (ref 0–40)
Albumin/Globulin Ratio: 1.4 (ref 1.2–2.2)
Albumin: 4.1 g/dL (ref 3.8–4.8)
Alkaline Phosphatase: 80 IU/L (ref 39–117)
BUN/Creatinine Ratio: 14 (ref 9–23)
BUN: 11 mg/dL (ref 6–24)
Bilirubin Total: <0.2 mg/dL (ref 0.0–1.2)
CO2: 24 mmol/L (ref 20–29)
Calcium: 9.6 mg/dL (ref 8.7–10.2)
Chloride: 102 mmol/L (ref 96–106)
Creatinine, Ser: 0.79 mg/dL (ref 0.57–1.00)
GFR calc Af Amer: 102 mL/min/{1.73_m2} (ref >59)
GFR calc non Af Amer: 88 mL/min/{1.73_m2} (ref >59)
Globulin, Total: 3 g/dL (ref 1.5–4.5)
Glucose: 169 mg/dL — ABNORMAL HIGH (ref 65–99)
Potassium: 4.5 mmol/L (ref 3.5–5.2)
Sodium: 140 mmol/L (ref 134–144)
Total Protein: 7.1 g/dL (ref 6.0–8.5)

## 2020-01-15 LAB — CBC WITH DIFFERENTIAL/PLATELET
Basophils Absolute: 0 10*3/uL (ref 0.0–0.2)
Basos: 0 %
EOS (ABSOLUTE): 0 10*3/uL (ref 0.0–0.4)
Eos: 0 %
Hematocrit: 35.5 % (ref 34.0–46.6)
Hemoglobin: 11.2 g/dL (ref 11.1–15.9)
Immature Grans (Abs): 0 10*3/uL (ref 0.0–0.1)
Immature Granulocytes: 0 %
Lymphocytes Absolute: 2.4 10*3/uL (ref 0.7–3.1)
Lymphs: 27 %
MCH: 27.4 pg (ref 26.6–33.0)
MCHC: 31.5 g/dL (ref 31.5–35.7)
MCV: 87 fL (ref 79–97)
Monocytes Absolute: 0.6 10*3/uL (ref 0.1–0.9)
Monocytes: 7 %
Neutrophils Absolute: 5.9 10*3/uL (ref 1.4–7.0)
Neutrophils: 66 %
Platelets: 376 10*3/uL (ref 150–450)
RBC: 4.09 x10E6/uL (ref 3.77–5.28)
RDW: 12.9 % (ref 11.7–15.4)
WBC: 8.9 10*3/uL (ref 3.4–10.8)

## 2020-01-15 LAB — LIPID PANEL
Chol/HDL Ratio: 2.2 ratio (ref 0.0–4.4)
Cholesterol, Total: 154 mg/dL (ref 100–199)
HDL: 69 mg/dL (ref >39)
LDL Chol Calc (NIH): 74 mg/dL (ref 0–99)
Triglycerides: 53 mg/dL (ref 0–149)
VLDL Cholesterol Cal: 11 mg/dL (ref 5–40)

## 2020-01-15 LAB — HEMOGLOBIN A1C
Est. average glucose Bld gHb Est-mCnc: 146 mg/dL
Hgb A1c MFr Bld: 6.7 % — ABNORMAL HIGH (ref 4.8–5.6)

## 2020-01-15 LAB — TSH: TSH: 2.2 u[IU]/mL (ref 0.450–4.500)

## 2020-01-15 NOTE — Telephone Encounter (Signed)
Called Patient left message for a return call, for lab results

## 2020-01-15 NOTE — Telephone Encounter (Signed)
-----   Message from Margaretann Loveless, [place] sent at 01/15/2020  1:55 PM EST ----- Blood count is normal. Kidney and liver function are normal. Sodium, potassium and calcium are normal. A1c continues to do well and is down to 6.7 from 6.9! Keep up the good work. Cholesterol is normal. Thyroid is normal.

## 2020-01-15 NOTE — Telephone Encounter (Signed)
-----   Message from Jennifer M Burnette, PA-C sent at 01/15/2020  1:55 PM EST ----- Blood count is normal. Kidney and liver function are normal. Sodium, potassium and calcium are normal. A1c continues to do well and is down to 6.7 from 6.9! Keep up the good work. Cholesterol is normal. Thyroid is normal.  

## 2020-01-15 NOTE — Telephone Encounter (Signed)
LMTCB

## 2020-01-16 ENCOUNTER — Encounter: Payer: Self-pay | Admitting: Physician Assistant

## 2020-01-16 NOTE — Telephone Encounter (Signed)
Comment seen by patient Carolyn Nolan on 01/15/2020  5:22 PM EST

## 2020-01-21 ENCOUNTER — Other Ambulatory Visit: Payer: Self-pay | Admitting: Physician Assistant

## 2020-01-21 DIAGNOSIS — E7800 Pure hypercholesterolemia, unspecified: Secondary | ICD-10-CM

## 2020-01-22 ENCOUNTER — Telehealth: Payer: Self-pay

## 2020-01-22 ENCOUNTER — Other Ambulatory Visit: Payer: Self-pay

## 2020-01-22 DIAGNOSIS — Z1211 Encounter for screening for malignant neoplasm of colon: Secondary | ICD-10-CM

## 2020-01-22 NOTE — Telephone Encounter (Signed)
Returned patients call to schedule her colonoscopy.  LVM for her to call office back.  Thanks [place], [place]

## 2020-01-22 NOTE — Telephone Encounter (Signed)
Gastroenterology Pre-Procedure Review  Request Date: Tuesday 02/19/20 Requesting Physician: Dr. Allen Norris  PATIENT REVIEW QUESTIONS: The patient responded to the following health history questions as indicated:    1. Are you having any GI issues? no 2. Do you have a personal history of Polyps? no 3. Do you have a family history of Colon Cancer or Polyps? no 4. Diabetes Mellitus? no 5. Joint replacements in the past 12 months?no 6. Major health problems in the past 3 months?no 7. Any artificial heart valves, MVP, or defibrillator?no    MEDICATIONS & ALLERGIES:    Patient reports the following regarding taking any anticoagulation/antiplatelet therapy:   Plavix, Coumadin, Eliquis, Xarelto, Lovenox, Pradaxa, Brilinta, or Effient? no Aspirin? no  Patient confirms/reports the following medications:  Current Outpatient Medications  Medication Sig Dispense Refill  . Ascorbic Acid (VITAMIN C) 1000 MG tablet Take 1,000 mg by mouth daily.    Marland Kitchen aspirin 81 MG tablet BAYER LOW STRENGTH, 81MG  (Oral Tablet Delayed Release)  1 Every Day for 0 days  Quantity: 0.00;  Refills: 0   Ordered :05-Oct-2010  Edmonia James ;  Started 09-May-2007 Active Comments: DX: 250.00    . azelastine (ASTELIN) 0.1 % nasal spray instill 1 spray into each nostril twice a day  0  . benzoyl peroxide-erythromycin (BENZAMYCIN) gel APP TO FACE QAM UTD    . Calcium Carbonate-Vitamin D 600-200 MG-UNIT CAPS CALCIUM + D, 600-200MG -UNIT (Oral Tablet)  1 Twice Daily for 0 days  Quantity: 0.00;  Refills: 0   Ordered :05-Oct-2010  Ashley Royalty ;  Started 03-July-2010 Active    . Cetirizine HCl 10 MG CAPS Take by mouth.    . cyclobenzaprine (FLEXERIL) 5 MG tablet TAKE 1 TABLET BY MOUTH AT BEDTIME AS NEEDED FOR MUSCLE SPASMS 30 tablet 3  . ferrous sulfate 325 (65 FE) MG tablet Take by mouth.    . IRON-VITAMINS PO Take by mouth.    Marland Kitchen JANUVIA 100 MG tablet TAKE 1 TABLET BY MOUTH EVERY DAY 90 tablet 1  . metFORMIN (GLUCOPHAGE) 1000 MG  tablet TAKE 1 TABLET BY MOUTH TWICE DAILY WITH FOOD 180 tablet 1  . montelukast (SINGULAIR) 10 MG tablet take 1 tablet by mouth at bedtime 90 tablet 1  . MULTIPLE VITAMIN PO MULTIVITAMINS (Oral Tablet)  1 Every Day for 0 days  Quantity: 0.00;  Refills: 0   Ordered :05-Oct-2010  Ashley Royalty ;  Started 18-Mar-2010 Active Comments: DX: 250.00    . nortriptyline (PAMELOR) 25 MG capsule Take by mouth.    . potassium gluconate (RA POTASSIUM GLUCONATE) 595 (99 K) MG TABS tablet Take by mouth.    . ramipril (ALTACE) 2.5 MG capsule TAKE 1 CAPSULE BY MOUTH ONCE DAILY. 90 capsule 1  . Semaglutide,0.25 or 0.5MG /DOS, (OZEMPIC, 0.25 OR 0.5 MG/DOSE,) 2 MG/1.5ML SOPN Inject 0.5 mg into the skin once a week. 4 pen 5  . simvastatin (ZOCOR) 10 MG tablet TAKE 1 TABLET BY MOUTH AT BEDTIME 90 tablet 1  . Tretinoin Microsphere (RETIN-A MICRO PUMP) 0.08 % GEL Apply topically at bedtime.    . triamterene-hydrochlorothiazide (MAXZIDE-25) 37.5-25 MG tablet TAKE 1 TABLET BY MOUTH EVERY DAY 90 tablet 1  . vitamin B-12 (CYANOCOBALAMIN) 1000 MCG tablet Take 1,000 mcg by mouth daily.    . Zinc 30 MG TABS Take by mouth.     No current facility-administered medications for this visit.    Patient confirms/reports the following allergies:  No Known Allergies  No orders of the defined types were placed in this  encounter.   AUTHORIZATION INFORMATION Primary Insurance: 1D#: Group #:  Secondary Insurance: 1D#: Group #:  SCHEDULE INFORMATION: Date: Tuesday 02/19/20 Time: Location:[facility]

## 2020-01-23 ENCOUNTER — Other Ambulatory Visit: Payer: Self-pay

## 2020-01-23 ENCOUNTER — Telehealth: Payer: Self-pay

## 2020-01-23 NOTE — Telephone Encounter (Signed)
Patient Carolyn Nolan for me to call her back to reschedule her 02/19/20 Colonoscopy with Dr. Servando Snare due to insurance reasons.  Returned her call Carolyn Nolan asking if she would like to reschedule to Tuesday 03/04/20 with Dr. Servando Snare at [facility].  Will await call back to reschedule.  Thanks,  [place], [place]

## 2020-02-12 ENCOUNTER — Ambulatory Visit
Admission: RE | Admit: 2020-02-12 | Discharge: 2020-02-12 | Disposition: A | Discharge status: Home / Self Care | Payer: Managed Care, Other (non HMO) | Source: Ambulatory Visit | Attending: Physician Assistant | Admitting: Physician Assistant

## 2020-02-12 DIAGNOSIS — Z1231 Encounter for screening mammogram for malignant neoplasm of breast: Secondary | ICD-10-CM | POA: Diagnosis present

## 2020-02-12 DIAGNOSIS — Z1239 Encounter for other screening for malignant neoplasm of breast: Secondary | ICD-10-CM

## 2020-02-14 ENCOUNTER — Telehealth: Payer: Self-pay

## 2020-02-14 NOTE — Telephone Encounter (Signed)
[employer]   Result Notes and Comments to Patient Comment seen by patient JAKITA DUTKIEWICZ on 02/13/2020 3:54 PM EST

## 2020-02-14 NOTE — Telephone Encounter (Signed)
-----   Message from Margaretann Loveless, [place] sent at 02/13/2020  3:52 PM EST ----- Normal mammogram. Repeat screening in one year.

## 2020-02-18 ENCOUNTER — Other Ambulatory Visit: Payer: Self-pay | Admitting: Physician Assistant

## 2020-02-18 DIAGNOSIS — E119 Type 2 diabetes mellitus without complications: Secondary | ICD-10-CM

## 2020-02-24 ENCOUNTER — Other Ambulatory Visit: Payer: Self-pay | Admitting: Physician Assistant

## 2020-02-24 DIAGNOSIS — E1129 Type 2 diabetes mellitus with other diabetic kidney complication: Secondary | ICD-10-CM

## 2020-02-25 NOTE — Telephone Encounter (Signed)
Requested Prescriptions  Pending Prescriptions Disp Refills  . OZEMPIC, 0.25 OR 0.5 MG/DOSE, 2 MG/1.5ML SOPN [Pharmacy Med Name: OZEMPIC 0.25 OR 0.5MG /DOS 1X2MG  PEN] 1.5 mL 4    Sig: INJECT 0.5 MG UNDER THE SKIN ONCE A WEEK     Endocrinology:  Diabetes - GLP-1 Receptor Agonists Passed - 02/24/2020  9:18 PM      Passed - HBA1C is between 0 and 7.9 and within 180 days    Hgb A1c MFr Bld  Date Value Ref Range Status  01/14/2020 6.7 (H) 4.8 - 5.6 % Final    Comment:             Prediabetes: 5.7 - 6.4          Diabetes: >6.4          Glycemic control for adults with diabetes: <7.0          Passed - Valid encounter within last 6 months    Recent Outpatient Visits          1 month ago Annual physical exam   [facility] Joycelyn Man M, PA-C   4 months ago Class 3 severe obesity due to excess calories with serious comorbidity and body mass index (BMI) of 40.0 to 44.9 in adult [facility])   [facility], [place], PA-C   7 months ago Type 2 diabetes mellitus with other specified complication, without long-term current use of insulin [facility])   [facility] [place], Alessandra [place], [place]   1 year ago Annual physical exam   [facility] [place], [place], [place]   1 year ago Type 2 diabetes mellitus without complication, without long-term current use of insulin [facility])   [facility] [place], [place], [place]

## 2020-02-29 ENCOUNTER — Other Ambulatory Visit: Payer: Self-pay

## 2020-02-29 ENCOUNTER — Other Ambulatory Visit
Admission: RE | Admit: 2020-02-29 | Discharge: 2020-02-29 | Disposition: A | Discharge status: Home / Self Care | Payer: Managed Care, Other (non HMO) | Source: Ambulatory Visit | Attending: Gastroenterology | Admitting: Gastroenterology

## 2020-02-29 DIAGNOSIS — Z01812 Encounter for preprocedural laboratory examination: Secondary | ICD-10-CM | POA: Diagnosis present

## 2020-02-29 DIAGNOSIS — Z20822 Contact with and (suspected) exposure to covid-19: Secondary | ICD-10-CM | POA: Diagnosis not present

## 2020-03-01 LAB — SARS CORONAVIRUS 2 (TAT 6-24 HRS): SARS Coronavirus 2: NEGATIVE

## 2020-03-03 ENCOUNTER — Encounter: Payer: Self-pay | Admitting: Gastroenterology

## 2020-03-04 ENCOUNTER — Ambulatory Visit: Payer: Managed Care, Other (non HMO) | Admitting: Anesthesiology

## 2020-03-04 ENCOUNTER — Encounter: Payer: Self-pay | Admitting: Gastroenterology

## 2020-03-04 ENCOUNTER — Other Ambulatory Visit: Payer: Self-pay

## 2020-03-04 ENCOUNTER — Encounter
Admission: RE | Disposition: A | Discharge status: Home / Self Care | Payer: Self-pay | Source: Home / Self Care | Attending: Gastroenterology

## 2020-03-04 ENCOUNTER — Ambulatory Visit
Admission: RE | Admit: 2020-03-04 | Discharge: 2020-03-04 | Disposition: A | Discharge status: Home / Self Care | Payer: Managed Care, Other (non HMO) | Attending: Gastroenterology | Admitting: Gastroenterology

## 2020-03-04 DIAGNOSIS — E119 Type 2 diabetes mellitus without complications: Secondary | ICD-10-CM | POA: Insufficient documentation

## 2020-03-04 DIAGNOSIS — Z1211 Encounter for screening for malignant neoplasm of colon: Secondary | ICD-10-CM | POA: Diagnosis not present

## 2020-03-04 DIAGNOSIS — Z79899 Other long term (current) drug therapy: Secondary | ICD-10-CM | POA: Insufficient documentation

## 2020-03-04 DIAGNOSIS — K640 First degree hemorrhoids: Secondary | ICD-10-CM | POA: Insufficient documentation

## 2020-03-04 DIAGNOSIS — Z7982 Long term (current) use of aspirin: Secondary | ICD-10-CM | POA: Diagnosis not present

## 2020-03-04 DIAGNOSIS — D649 Anemia, unspecified: Secondary | ICD-10-CM | POA: Insufficient documentation

## 2020-03-04 DIAGNOSIS — I10 Essential (primary) hypertension: Secondary | ICD-10-CM | POA: Diagnosis not present

## 2020-03-04 DIAGNOSIS — Z7984 Long term (current) use of oral hypoglycemic drugs: Secondary | ICD-10-CM | POA: Insufficient documentation

## 2020-03-04 HISTORY — PX: COLONOSCOPY WITH PROPOFOL: SHX5780

## 2020-03-04 HISTORY — DX: Essential (primary) hypertension: I10

## 2020-03-04 HISTORY — DX: Type 2 diabetes mellitus without complications: E11.9

## 2020-03-04 LAB — GLUCOSE, CAPILLARY: Glucose-Capillary: 143 mg/dL — ABNORMAL HIGH (ref 70–99)

## 2020-03-04 LAB — POCT PREGNANCY, URINE: Preg Test, Ur: NEGATIVE

## 2020-03-04 SURGERY — COLONOSCOPY WITH PROPOFOL
Anesthesia: General

## 2020-03-04 MED ORDER — ONDANSETRON HCL 4 MG/2ML IJ SOLN
INTRAMUSCULAR | Status: AC
  Filled 2020-03-04: qty 2

## 2020-03-04 MED ORDER — FENTANYL CITRATE (PF) 100 MCG/2ML IJ SOLN
INTRAMUSCULAR | Status: AC
  Filled 2020-03-04: qty 2

## 2020-03-04 MED ORDER — PROPOFOL 500 MG/50ML IV EMUL
INTRAVENOUS | Status: AC
  Filled 2020-03-04: qty 50

## 2020-03-04 MED ORDER — FENTANYL CITRATE (PF) 100 MCG/2ML IJ SOLN
INTRAMUSCULAR | Status: DC | PRN
  Administered 2020-03-04: 50 ug via INTRAVENOUS

## 2020-03-04 MED ORDER — MIDAZOLAM HCL 2 MG/2ML IJ SOLN
INTRAMUSCULAR | Status: AC
  Filled 2020-03-04: qty 2

## 2020-03-04 MED ORDER — SODIUM CHLORIDE 0.9 % IV SOLN: INTRAVENOUS | Status: DC

## 2020-03-04 MED ORDER — MIDAZOLAM HCL 2 MG/2ML IJ SOLN
INTRAMUSCULAR | Status: DC | PRN
  Administered 2020-03-04: 2 mg via INTRAVENOUS

## 2020-03-04 MED ORDER — ONDANSETRON HCL 4 MG/2ML IJ SOLN
INTRAMUSCULAR | Status: DC | PRN
  Administered 2020-03-04: 4 mg via INTRAVENOUS

## 2020-03-04 MED ORDER — PROPOFOL 500 MG/50ML IV EMUL
INTRAVENOUS | Status: DC | PRN
  Administered 2020-03-04: 100 ug/kg/min via INTRAVENOUS

## 2020-03-04 NOTE — Transfer of Care (Signed)
Immediate Anesthesia Transfer of Care Note  Patient: Carolyn Nolan  Procedure(s) Performed: COLONOSCOPY WITH PROPOFOL (N/A )  Patient Location: PACU  Anesthesia Type:General  Level of Consciousness: awake and sedated  Airway & Oxygen Therapy: Patient Spontanous Breathing and Patient connected to nasal cannula oxygen  Post-op Assessment: Report given to RN and Post -op Vital signs reviewed and stable  Post vital signs: Reviewed and stable  Last Vitals:  Vitals Value Taken Time  BP 101/67 03/04/20 0828  Temp 36.6 C 03/04/20 0828  Pulse 98 03/04/20 0829  Resp 14 03/04/20 0829  SpO2 99 % 03/04/20 0829  Vitals shown include unvalidated device data.  Last Pain:  Vitals:   03/04/20 0828  TempSrc: Temporal  PainSc: 0-No pain         Complications: No apparent anesthesia complications

## 2020-03-04 NOTE — H&P (Signed)
Midge Minium, MD [facility] [address] [place], [place] [address] Phone: [phone] Fax : [phone]  Primary Care Physician:  Margaretann Loveless, PA-C Primary Gastroenterologist:  Dr. Servando Snare  Pre-Procedure History & Physical: HPI:  Carolyn Nolan is a 49 y.o. female is here for a screening colonoscopy.   Past Medical History:  Diagnosis Date  . Diabetes mellitus without complication (HCC)   . Hypertension     Past Surgical History:  Procedure Laterality Date  . BREAST BIOPSY Left 2019   Keloid biopsy,benign     Prior to Admission medications   Medication Sig Start Date End Date Taking? Authorizing Provider  azelastine (ASTELIN) 0.1 % nasal spray instill 1 spray into each nostril twice a day 09/09/17  Yes [provider]  JANUVIA 100 MG tablet TAKE 1 TABLET BY MOUTH EVERY DAY 02/18/20  Yes Joycelyn Man M, PA-C  metFORMIN (GLUCOPHAGE) 1000 MG tablet TAKE 1 TABLET BY MOUTH TWICE DAILY WITH FOOD 08/22/19  Yes Joycelyn Man M, PA-C  Ascorbic Acid (VITAMIN C) 1000 MG tablet Take 1,000 mg by mouth daily.    [provider]  aspirin 81 MG tablet BAYER LOW STRENGTH, 81MG  (Oral Tablet Delayed Release)  1 Every Day for 0 days  Quantity: 0.00;  Refills: 0   Ordered :05-Oct-2010  07-Oct-2010 ;  Started 09-May-2007 Active Comments: DX: 250.00 05/09/07   [provider]  benzoyl peroxide-erythromycin (BENZAMYCIN) gel APP TO FACE QAM UTD 06/18/19   [provider]  Calcium Carbonate-Vitamin D 600-200 MG-UNIT CAPS CALCIUM + D, 600-200MG -UNIT (Oral Tablet)  1 Twice Daily for 0 days  Quantity: 0.00;  Refills: 0   Ordered :05-Oct-2010  07-Oct-2010 ;  Started 03-July-2010 Active 07/03/10   [provider]  Cetirizine HCl 10 MG CAPS Take by mouth. 04/12/12   [provider]  cyclobenzaprine (FLEXERIL) 5 MG tablet TAKE 1 TABLET BY MOUTH AT BEDTIME AS NEEDED FOR MUSCLE SPASMS 11/24/18   11/26/18, PA-C  ferrous sulfate  325 (65 FE) MG tablet Take by mouth.    [provider]  IRON-VITAMINS PO Take by mouth.    [provider]  montelukast (SINGULAIR) 10 MG tablet take 1 tablet by mouth at bedtime 04/23/16   06/23/16, MD  MULTIPLE VITAMIN PO MULTIVITAMINS (Oral Tablet)  1 Every Day for 0 days  Quantity: 0.00;  Refills: 0   Ordered :05-Oct-2010  07-Oct-2010 ;  Started 18-Mar-2010 Active Comments: DX: 250.00 03/18/10   [provider]  nortriptyline (PAMELOR) 25 MG capsule Take by mouth. 12/31/19   [provider]  OZEMPIC, 0.25 OR 0.5 MG/DOSE, 2 MG/1.5ML SOPN INJECT 0.5 MG UNDER THE SKIN ONCE A WEEK 02/25/20   02/27/20 M, PA-C  potassium gluconate (RA POTASSIUM GLUCONATE) 595 (99 K) MG TABS tablet Take by mouth. 02/08/11   [provider]  ramipril (ALTACE) 2.5 MG capsule TAKE 1 CAPSULE BY MOUTH ONCE DAILY. 09/28/19   09/30/19, PA-C  simvastatin (ZOCOR) 10 MG tablet TAKE 1 TABLET BY MOUTH AT BEDTIME 01/21/20   03/20/20, PA-C  Tretinoin Microsphere (RETIN-A MICRO PUMP) 0.08 % GEL Apply topically at bedtime.    [provider]  triamterene-hydrochlorothiazide (MAXZIDE-25) 37.5-25 MG tablet TAKE 1 TABLET BY MOUTH EVERY DAY 11/19/19   14/7/20, PA-C  vitamin B-12 (CYANOCOBALAMIN) 1000 MCG tablet Take 1,000 mcg by mouth daily.    [provider]  Zinc 30 MG TABS Take by mouth.  [provider]    Allergies as of 01/22/2020  . (No Known Allergies)    Family History  Problem Relation Age of Onset  . Diabetes Mother   . Diabetes Father   . Hypertension Father   . Healthy Sister   . Breast cancer Cousin     Social History   Socioeconomic History  . Marital status: Single    Spouse name: Not on file  . Number of children: Not on file  . Years of education: Not on file  . Highest education level: Not on file  Occupational History    Employer: [employer]  Tobacco Use  . Smoking  status: Never Smoker  . Smokeless tobacco: Never Used  Substance and Sexual Activity  . Alcohol use: Yes    Comment: Occasional Wine,none last 24hrs  . Drug use: No  . Sexual activity: Never  Other Topics Concern  . Not on file  Social History Narrative  . Not on file   Social Determinants of Health   Financial Resource Strain:   . Difficulty of Paying Living Expenses:   Food Insecurity:   . Worried About Charity fundraiser in the Last Year:   . Arboriculturist in the Last Year:   Transportation Needs:   . Film/video editor (Medical):   Marland Kitchen Lack of Transportation (Non-Medical):   Physical Activity:   . Days of Exercise per Week:   . Minutes of Exercise per Session:   Stress:   . Feeling of Stress :   Social Connections:   . Frequency of Communication with Friends and Family:   . Frequency of Social Gatherings with Friends and Family:   . Attends Religious Services:   . Active Member of Clubs or Organizations:   . Attends Archivist Meetings:   Marland Kitchen Marital Status:   Intimate Partner Violence:   . Fear of Current or Ex-Partner:   . Emotionally Abused:   Marland Kitchen Physically Abused:   . Sexually Abused:     Review of Systems: See HPI, otherwise negative ROS  Physical Exam: BP (!) 179/107   Pulse (!) 103   Temp (!) 96.9 F (36.1 C) (Temporal)   Resp 18   Ht 5\' 4"  (1.626 m)   Wt 111.6 kg   LMP 01/05/2020   SpO2 100%   BMI 42.23 kg/m  General:   Alert,  pleasant and cooperative in NAD Head:  Normocephalic and atraumatic. Neck:  Supple; no masses or thyromegaly. Lungs:  Clear throughout to auscultation.    Heart:  Regular rate and rhythm. Abdomen:  Soft, nontender and nondistended. Normal bowel sounds, without guarding, and without rebound.   Neurologic:  Alert and  oriented x4;  grossly normal neurologically.  Impression/Plan: Carolyn Nolan is now here to undergo a screening colonoscopy.  Risks, benefits, and alternatives regarding colonoscopy have  been reviewed with the patient.  Questions have been answered.  All parties agreeable.

## 2020-03-04 NOTE — Anesthesia Preprocedure Evaluation (Signed)
Anesthesia Evaluation  Patient identified by MRN, date of birth, ID band Patient awake    Reviewed: Allergy & Precautions, H&P , NPO status , Patient's Chart, lab work & pertinent test results, reviewed documented beta blocker date and time   Airway Mallampati: II   Neck ROM: full    Dental  (+) Poor Dentition   Pulmonary neg pulmonary ROS,    Pulmonary exam normal        Cardiovascular Exercise Tolerance: Good hypertension, On Medications negative cardio ROS Normal cardiovascular exam Rhythm:regular Rate:Normal     Neuro/Psych negative neurological ROS  negative psych ROS   GI/Hepatic negative GI ROS, Neg liver ROS,   Endo/Other  negative endocrine ROSdiabetes, Well Controlled, Type 2, Oral Hypoglycemic Agents  Renal/GU negative Renal ROS  negative genitourinary   Musculoskeletal   Abdominal   Peds  Hematology  (+) Blood dyscrasia, anemia ,   Anesthesia Other Findings Past Medical History: No date: Diabetes mellitus without complication [facility]) Past Surgical History: 2019: BREAST BIOPSY; Left     Comment:  Keloid biopsy,benign  No date: NO PAST SURGERIES   Reproductive/Obstetrics negative OB ROS                             Anesthesia Physical Anesthesia Plan  ASA: III  Anesthesia Plan: General   Post-op Pain Management:    Induction:   PONV Risk Score and Plan:   Airway Management Planned:   Additional Equipment:   Intra-op Plan:   Post-operative Plan:   Informed Consent: I have reviewed the patients History and Physical, chart, labs and discussed the procedure including the risks, benefits and alternatives for the proposed anesthesia with the patient or authorized representative who has indicated his/her understanding and acceptance.     Dental Advisory Given  Plan Discussed with: CRNA  Anesthesia Plan Comments:         Anesthesia Quick Evaluation

## 2020-03-04 NOTE — Op Note (Signed)
[facility] Gastroenterology Patient Name: Carolyn Nolan Procedure Date: 03/04/2020 8:04 AM MRN: [id number] Account #: [id number] Date of Birth: [birth year] Admit Type: Outpatient Age: 49 Room: [facility] ENDO ROOM 4 Gender: Female Note Status: Finalized Procedure:             Colonoscopy Indications:           Screening for colorectal malignant neoplasm Providers:             Lucilla Lame MD, MD Referring MD:          Ethelda Chick MD, MD (Referring MD) Medicines:             Propofol per Anesthesia Complications:         No immediate complications. Procedure:             Pre-Anesthesia Assessment:                        - Prior to the procedure, a History and Physical was                         performed, and patient medications and allergies were                         reviewed. The patient's tolerance of previous                         anesthesia was also reviewed. The risks and benefits                         of the procedure and the sedation options and risks                         were discussed with the patient. All questions were                         answered, and informed consent was obtained. Prior                         Anticoagulants: The patient has taken no previous                         anticoagulant or antiplatelet agents. ASA Grade                         Assessment: II - A patient with mild systemic disease.                         After reviewing the risks and benefits, the patient                         was deemed in satisfactory condition to undergo the                         procedure.                        After obtaining informed consent, the colonoscope was  passed under direct vision. Throughout the procedure,                         the patient's blood pressure, pulse, and oxygen                         saturations were monitored continuously. The                         Colonoscope was introduced through the  anus and                         advanced to the the cecum, identified by appendiceal                         orifice and ileocecal valve. The colonoscopy was                         performed without difficulty. The patient tolerated                         the procedure well. The quality of the bowel                         preparation was excellent. Findings:      The perianal and digital rectal examinations were normal.      Non-bleeding internal hemorrhoids were found during retroflexion. The       hemorrhoids were Grade I (internal hemorrhoids that do not prolapse). Impression:            - Non-bleeding internal hemorrhoids.                        - No specimens collected. Recommendation:        - Discharge patient to home.                        - Resume previous diet.                        - Continue present medications.                        - Repeat colonoscopy in 10 years for screening unless                         any change in family history or lower GI problems. Procedure Code(s):     --- Professional ---                        [phone], Colonoscopy, flexible; diagnostic, including                         collection of specimen(s) by brushing or washing, when                         performed (separate procedure) Diagnosis Code(s):     --- Professional ---                        Z12.11, Encounter for  screening for malignant neoplasm                         of colon CPT copyright 2019 American Medical Association. All rights reserved. The codes documented in this report are preliminary and upon coder review may  be revised to meet current compliance requirements. Midge Minium MD, MD 03/04/2020 8:26:03 AM This report has been signed electronically. Number of Addenda: 0 Note Initiated On: 03/04/2020 8:04 AM Scope Withdrawal Time: 0 hours 7 minutes 45 seconds  Total Procedure Duration: 0 hours 12 minutes 38 seconds  Estimated Blood Loss:  Estimated blood loss: none.       [facility]

## 2020-03-05 ENCOUNTER — Encounter: Payer: Self-pay | Admitting: *Deleted

## 2020-03-05 NOTE — Anesthesia Postprocedure Evaluation (Signed)
Anesthesia Post Note  Patient: Carolyn Nolan  Procedure(s) Performed: COLONOSCOPY WITH PROPOFOL (N/A )  Patient location during evaluation: PACU Anesthesia Type: General Level of consciousness: awake and alert Pain management: pain level controlled Vital Signs Assessment: post-procedure vital signs reviewed and stable Respiratory status: spontaneous breathing, nonlabored ventilation, respiratory function stable and patient connected to nasal cannula oxygen Cardiovascular status: blood pressure returned to baseline and stable Postop Assessment: no apparent nausea or vomiting Anesthetic complications: no     Last Vitals:  Vitals:   03/04/20 0848 03/04/20 0858  BP: (!) 123/93   Pulse: 86 87  Resp: 12 (!) 23  Temp:    SpO2: 100% 98%    Last Pain:  Vitals:   03/04/20 0858  TempSrc:   PainSc: 0-No pain                 Yevette Edwards

## 2020-03-19 ENCOUNTER — Encounter: Payer: Self-pay | Admitting: Physician Assistant

## 2020-03-19 ENCOUNTER — Other Ambulatory Visit: Payer: Self-pay | Admitting: Physician Assistant

## 2020-03-19 DIAGNOSIS — E119 Type 2 diabetes mellitus without complications: Secondary | ICD-10-CM

## 2020-03-19 DIAGNOSIS — I10 Essential (primary) hypertension: Secondary | ICD-10-CM

## 2020-06-26 ENCOUNTER — Other Ambulatory Visit: Payer: Self-pay | Admitting: Physician Assistant

## 2020-06-26 DIAGNOSIS — I10 Essential (primary) hypertension: Secondary | ICD-10-CM

## 2020-07-27 ENCOUNTER — Other Ambulatory Visit: Payer: Self-pay | Admitting: Physician Assistant

## 2020-07-27 DIAGNOSIS — E7800 Pure hypercholesterolemia, unspecified: Secondary | ICD-10-CM

## 2020-08-05 ENCOUNTER — Other Ambulatory Visit: Payer: Self-pay | Admitting: Physician Assistant

## 2020-08-05 DIAGNOSIS — R809 Proteinuria, unspecified: Secondary | ICD-10-CM

## 2020-08-05 DIAGNOSIS — E1129 Type 2 diabetes mellitus with other diabetic kidney complication: Secondary | ICD-10-CM

## 2020-08-05 NOTE — Telephone Encounter (Signed)
Requested Prescriptions  Pending Prescriptions Disp Refills   OZEMPIC, 0.25 OR 0.5 MG/DOSE, 2 MG/1.5ML SOPN [Pharmacy Med Name: OZEMPIC 0.25 OR 0.5MG /DOS 1X2MG  PEN] 1.5 mL 0    Sig: INJECT 0.5MG  UNDER THE SKIN ONCE A WEEK     Endocrinology:  Diabetes - GLP-1 Receptor Agonists Failed - 08/05/2020  2:41 AM      Failed - HBA1C is between 0 and 7.9 and within 180 days    Hgb A1c MFr Bld  Date Value Ref Range Status  01/14/2020 6.7 (H) 4.8 - 5.6 % Final    Comment:             Prediabetes: 5.7 - 6.4          Diabetes: >6.4          Glycemic control for adults with diabetes: <7.0          Failed - Valid encounter within last 6 months    Recent Outpatient Visits          6 months ago Annual physical exam   [facility] Joycelyn Man M, PA-C   10 months ago Class 3 severe obesity due to excess calories with serious comorbidity and body mass index (BMI) of 40.0 to 44.9 in adult [facility])   [facility] [place], [place], [place]   1 year ago Type 2 diabetes mellitus with other specified complication, without long-term current use of insulin [facility])   [facility] [place], [place]essandra [place], [place]   1 year ago Annual physical exam   [facility] Joycelyn Man [place], [place]   2 years ago Type 2 diabetes mellitus without complication, without long-term current use of insulin [facility])   [facility], [place], [place]             One month courtesy refill with reminder for patient to schedule an office visit for 6 month f/u.

## 2020-09-04 ENCOUNTER — Other Ambulatory Visit: Payer: Self-pay | Admitting: Physician Assistant

## 2020-09-04 DIAGNOSIS — E1129 Type 2 diabetes mellitus with other diabetic kidney complication: Secondary | ICD-10-CM

## 2020-09-04 DIAGNOSIS — R809 Proteinuria, unspecified: Secondary | ICD-10-CM

## 2020-09-04 NOTE — Telephone Encounter (Signed)
   Notes to clinic Has already had a curtesy refill and no appt is scheduled.

## 2020-09-15 ENCOUNTER — Other Ambulatory Visit: Payer: Self-pay | Admitting: Physician Assistant

## 2020-09-15 DIAGNOSIS — I10 Essential (primary) hypertension: Secondary | ICD-10-CM

## 2020-09-15 DIAGNOSIS — E119 Type 2 diabetes mellitus without complications: Secondary | ICD-10-CM

## 2020-09-15 NOTE — Telephone Encounter (Signed)
Called pt and LM on VM to call office to schedule appt- 30 day courtesy RF given Requested Prescriptions  Pending Prescriptions Disp Refills   metFORMIN (GLUCOPHAGE) 1000 MG tablet [Pharmacy Med Name: METFORMIN 1000MG TABLETS] 60 tablet 0    Sig: TAKE 1 TABLET BY MOUTH TWICE DAILY WITH FOOD     Endocrinology:  Diabetes - Biguanides Failed - 09/15/2020  7:09 AM      Failed - HBA1C is between 0 and 7.9 and within 180 days    Hgb A1c MFr Bld  Date Value Ref Range Status  01/14/2020 6.7 (H) 4.8 - 5.6 % Final    Comment:             Prediabetes: 5.7 - 6.4          Diabetes: >6.4          Glycemic control for adults with diabetes: <7.0          Failed - Valid encounter within last 6 months    Recent Outpatient Visits          8 months ago Annual physical exam   [facility] Fenton Malling M, PA-C   11 months ago Class 3 severe obesity due to excess calories with serious comorbidity and body mass index (BMI) of 40.0 to 44.9 in adult [facility])   [facility] [employer], [place], PA-C   1 year ago Type 2 diabetes mellitus with other specified complication, without long-term current use of insulin [facility])   [place], [place], [place]   1 year ago Annual physical exam   [facility] Fenton Malling M, [place]   2 years ago Type 2 diabetes mellitus without complication, without long-term current use of insulin [facility])   [facility] [place], [place] M, [place]             Passed - Cr in normal range and within 360 days    Creatinine, Ser  Date Value Ref Range Status  01/14/2020 0.79 0.57 - 1.00 mg/dL Final         Passed - eGFR in normal range and within 360 days    GFR calc Af Amer  Date Value Ref Range Status  01/14/2020 102 >59 mL/min/1.73 Final   GFR calc non Af Amer  Date Value Ref Range Status  01/14/2020 88 >59 mL/min/1.73 Final          ramipril (ALTACE) 2.5 MG capsule [Pharmacy Med Name:  RAMIPRIL 2.5MG CAPSULES] 90 capsule 1    Sig: TAKE 1 CAPSULE BY MOUTH EVERY DAY     Cardiovascular:  ACE Inhibitors Failed - 09/15/2020  7:09 AM      Failed - Cr in normal range and within 180 days    Creatinine, Ser  Date Value Ref Range Status  01/14/2020 0.79 0.57 - 1.00 mg/dL Final         Failed - K in normal range and within 180 days    Potassium  Date Value Ref Range Status  01/14/2020 4.5 3.5 - 5.2 mmol/L Final         Failed - Last BP in normal range    BP Readings from Last 1 Encounters:  03/04/20 (!) 123/93         Failed - Valid encounter within last 6 months    Recent Outpatient Visits          8 months ago Annual physical exam   [facility] Mar [place],  PA-C   11 months ago Class 3 severe obesity due to excess calories with serious comorbidity and body mass index (BMI) of 40.0 to 44.9 in adult [facility])   [place], [place], [place]   1 year ago Type 2 diabetes mellitus with other specified complication, without long-term current use of insulin [facility])   [place], [place], [place]   1 year ago Annual physical exam   [facility] Fenton Malling M, [place]   2 years ago Type 2 diabetes mellitus without complication, without long-term current use of insulin [facility])   [facility], [place], [place]             Passed - Patient is not pregnant

## 2020-09-24 ENCOUNTER — Encounter: Payer: Self-pay | Admitting: Physician Assistant

## 2020-10-01 ENCOUNTER — Other Ambulatory Visit: Payer: Self-pay | Admitting: Physician Assistant

## 2020-10-01 DIAGNOSIS — I10 Essential (primary) hypertension: Secondary | ICD-10-CM

## 2020-10-01 NOTE — Telephone Encounter (Signed)
Requested Prescriptions  Pending Prescriptions Disp Refills  . triamterene-hydrochlorothiazide (MAXZIDE-25) 37.5-25 MG tablet [Pharmacy Med Name: TRIAMTERENE 37.5MG / HCTZ 25MG  TABS] 30 tablet 0    Sig: TAKE 1 TABLET BY MOUTH EVERY DAY     Cardiovascular: Diuretic Combos Failed - 10/01/2020  3:06 AM      Failed - Last BP in normal range    BP Readings from Last 1 Encounters:  03/04/20 (!) 123/93         Failed - Valid encounter within last 6 months    Recent Outpatient Visits          8 months ago Annual physical exam   [facility], M   11 months ago Class 3 severe obesity due to excess calories with serious comorbidity and body mass index (BMI) of 40.0 to 44.9 in adult [facility])   [facility] [place], [place], PA-C   1 year ago Type 2 diabetes mellitus with other specified complication, without long-term current use of insulin [facility])   [facility] [place], [place], [place]   1 year ago Annual physical exam   [facility] M, M   2 years ago Type 2 diabetes mellitus without complication, without long-term current use of insulin [facility])   [facility] [place], [place] M, M             Passed - K in normal range and within 360 days    Potassium  Date Value Ref Range Status  01/14/2020 4.5 3.5 - 5.2 mmol/L Final         Passed - Na in normal range and within 360 days    Sodium  Date Value Ref Range Status  01/14/2020 140 134 - 144 mmol/L Final         Passed - Cr in normal range and within 360 days    Creatinine, Ser  Date Value Ref Range Status  01/14/2020 0.79 0.57 - 1.00 mg/dL Final         Passed - Ca in normal range and within 360 days    Calcium  Date Value Ref Range Status  01/14/2020 9.6 8.7 - 10.2 mg/dL Final         One month supply with reminder for patient to call and schedule a follow-up appointment.

## 2020-10-07 NOTE — Progress Notes (Signed)
Established patient visit   Patient: Carolyn Nolan   DOB: 1971/08/05   48 y.o. Female  MRN: [id number] Visit Date: 10/13/2020  Today's healthcare provider: Margaretann Loveless, PA-C   Chief Complaint  Patient presents with  . Follow-up   Subjective    HPI  Diabetes Mellitus Type II, Follow-up  Lab Results  Component Value Date   HGBA1C 7.0 (A) 10/13/2020   HGBA1C 6.7 (H) 01/14/2020   HGBA1C 6.9 (A) 10/10/2019   Wt Readings from Last 3 Encounters:  10/13/20 251 lb 12.8 oz (114.2 kg)  03/04/20 246 lb (111.6 kg)  01/14/20 246 lb 9.6 oz (111.9 kg)   Last seen for diabetes 6 months ago.  Management since then includes none, continue current medications. She reports excellent compliance with treatment. She is not having side effects.   Symptoms: No fatigue No foot ulcerations  No appetite changes No nausea  No paresthesia of the feet  No polydipsia  No polyuria No visual disturbances   No vomiting     Home blood sugar records: not being checked  Episodes of hypoglycemia? No not being checked.   Most Recent Eye Exam: scheduled 10/27/2020 Current exercise: none Current diet habits: in general, an "unhealthy" diet  Pertinent Labs: Lab Results  Component Value Date   CHOL 154 01/14/2020   HDL 69 01/14/2020   LDLCALC 74 01/14/2020   TRIG 53 01/14/2020   CHOLHDL 2.2 01/14/2020   Lab Results  Component Value Date   NA 140 01/14/2020   K 4.5 01/14/2020   CREATININE 0.79 01/14/2020   GFRNONAA 88 01/14/2020   GFRAA 102 01/14/2020   GLUCOSE 169 (H) 01/14/2020     ---------------------------------------------------------------------------------------------------  Patient Active Problem List   Diagnosis Date Noted  . Encounter for screening colonoscopy   . Hypercholesterolemia 01/10/2019  . Hyperlipidemia associated with type 2 diabetes mellitus (HCC) 01/08/2019  . Class 3 severe obesity due to excess calories with serious comorbidity and body mass index  (BMI) of 40.0 to 44.9 in adult (HCC) 01/08/2019  . Costalchondritis 10/06/2015  . Allergic rhinitis 04/29/2015  . Adult BMI 30+ 04/29/2015  . History of DVT (deep vein thrombosis) 04/29/2015  . Burning or prickling sensation 04/29/2015  . Anemia, iron deficiency 06/24/2009  . Diabetes mellitus, type 2 (HCC) 08/19/2000  . BP (high blood pressure) 02/07/1996   Past Medical History:  Diagnosis Date  . Diabetes mellitus without complication (HCC)   . Hypertension        Medications: Outpatient Medications Prior to Visit  Medication Sig  . Ascorbic Acid (VITAMIN C) 1000 MG tablet Take 1,000 mg by mouth daily.  Marland Kitchen aspirin 81 MG tablet BAYER LOW STRENGTH, 81MG  (Oral Tablet Delayed Release)  1 Every Day for 0 days  Quantity: 0.00;  Refills: 0   Ordered :05-Oct-2010  07-Oct-2010 ;  Started 09-May-2007 Active Comments: DX: 250.00  . azelastine (ASTELIN) 0.1 % nasal spray instill 1 spray into each nostril twice a day  . benzoyl peroxide-erythromycin (BENZAMYCIN) gel APP TO FACE QAM UTD  . Calcium Carbonate-Vitamin D 600-200 MG-UNIT CAPS CALCIUM + D, 600-200MG -UNIT (Oral Tablet)  1 Twice Daily for 0 days  Quantity: 0.00;  Refills: 0   Ordered :05-Oct-2010  07-Oct-2010 ;  Started 03-July-2010 Active  . Cetirizine HCl 10 MG CAPS Take by mouth.  . cyclobenzaprine (FLEXERIL) 5 MG tablet TAKE 1 TABLET BY MOUTH AT BEDTIME AS NEEDED FOR MUSCLE SPASMS  . ferrous sulfate 325 (65 FE) MG  tablet Take by mouth.  . IRON-VITAMINS PO Take by mouth.  . montelukast (SINGULAIR) 10 MG tablet take 1 tablet by mouth at bedtime  . MULTIPLE VITAMIN PO MULTIVITAMINS (Oral Tablet)  1 Every Day for 0 days  Quantity: 0.00;  Refills: 0   Ordered :05-Oct-2010  Kavin Leech ;  Started 18-Mar-2010 Active Comments: DX: 250.00  . nortriptyline (PAMELOR) 25 MG capsule Take by mouth.  Marland Kitchen OZEMPIC, 0.25 OR 0.5 MG/DOSE, 2 MG/1.5ML SOPN INJECT 0.5MG  UNDER THE SKIN ONCE A WEEK  . potassium gluconate (RA POTASSIUM GLUCONATE) 595  (99 K) MG TABS tablet Take by mouth.  . Tretinoin Microsphere (RETIN-A MICRO PUMP) 0.08 % GEL Apply topically at bedtime.  . vitamin B-12 (CYANOCOBALAMIN) 1000 MCG tablet Take 1,000 mcg by mouth daily.  . Zinc 30 MG TABS Take by mouth.  . [DISCONTINUED] JANUVIA 100 MG tablet TAKE 1 TABLET BY MOUTH EVERY DAY  . [DISCONTINUED] metFORMIN (GLUCOPHAGE) 1000 MG tablet TAKE 1 TABLET BY MOUTH TWICE DAILY WITH FOOD  . [DISCONTINUED] ramipril (ALTACE) 2.5 MG capsule TAKE 1 CAPSULE BY MOUTH EVERY DAY  . [DISCONTINUED] simvastatin (ZOCOR) 10 MG tablet TAKE 1 TABLET BY MOUTH AT BEDTIME  . [DISCONTINUED] triamterene-hydrochlorothiazide (MAXZIDE-25) 37.5-25 MG tablet TAKE 1 TABLET BY MOUTH EVERY DAY   No facility-administered medications prior to visit.    Review of Systems  Constitutional: Negative.   Respiratory: Negative.   Cardiovascular: Negative.   Endocrine: Negative for polydipsia, polyphagia and polyuria.  Psychiatric/Behavioral: Negative.     Last CBC Lab Results  Component Value Date   WBC 8.9 01/14/2020   HGB 11.2 01/14/2020   HCT 35.5 01/14/2020   MCV 87 01/14/2020   MCH 27.4 01/14/2020   RDW 12.9 01/14/2020   PLT 376 01/14/2020   Last metabolic panel Lab Results  Component Value Date   GLUCOSE 169 (H) 01/14/2020   NA 140 01/14/2020   K 4.5 01/14/2020   CL 102 01/14/2020   CO2 24 01/14/2020   BUN 11 01/14/2020   CREATININE 0.79 01/14/2020   GFRNONAA 88 01/14/2020   GFRAA 102 01/14/2020   CALCIUM 9.6 01/14/2020   PROT 7.1 01/14/2020   ALBUMIN 4.1 01/14/2020   LABGLOB 3.0 01/14/2020   AGRATIO 1.4 01/14/2020   BILITOT <0.2 01/14/2020   ALKPHOS 80 01/14/2020   AST 15 01/14/2020   ALT 19 01/14/2020      Objective    BP 127/84 (BP Location: Left Arm, Patient Position: Sitting, Cuff Size: Large)   Pulse (!) 106   Temp 98.5 F (36.9 C) (Oral)   Resp 16   Wt 251 lb 12.8 oz (114.2 kg)   BMI 43.22 kg/m  BP Readings from Last 3 Encounters:  10/13/20 127/84   03/04/20 (!) 123/93  01/14/20 137/88   Wt Readings from Last 3 Encounters:  10/13/20 251 lb 12.8 oz (114.2 kg)  03/04/20 246 lb (111.6 kg)  01/14/20 246 lb 9.6 oz (111.9 kg)      Physical Exam Vitals reviewed.  Constitutional:      General: She is not in acute distress.    Appearance: Normal appearance. She is well-developed. She is obese. She is not ill-appearing or diaphoretic.  Cardiovascular:     Rate and Rhythm: Normal rate and regular rhythm.     Heart sounds: Normal heart sounds. No murmur heard.  No friction rub. No gallop.   Pulmonary:     Effort: Pulmonary effort is normal. No respiratory distress.     Breath sounds: Normal  breath sounds. No wheezing or rales.  Musculoskeletal:     Cervical back: Normal range of motion and neck supple.  Neurological:     Mental Status: She is alert.       Results for orders placed or performed in visit on 10/13/20  POCT glycosylated hemoglobin (Hb A1C)  Result Value Ref Range   Hemoglobin A1C 7.0 (A) 4.0 - 5.6 %   Est. average glucose Bld gHb Est-mCnc 154     Assessment & Plan     1. Type 2 diabetes mellitus with other specified complication, without long-term current use of insulin (HCC) A1c did increase slightly from 6.7 to 7.0. Advised to continue same medications currently on but focus more on the lifestyle modifications again. On ACE and statin.  I will see her back in February for her CPE.   2. Essential hypertension Stable. Diagnosis pulled for medication refill. Continue current medical treatment plan. - ramipril (ALTACE) 2.5 MG capsule; Take 1 capsule (2.5 mg total) by mouth daily.  Dispense: 90 capsule; Refill: 1 - triamterene-hydrochlorothiazide (MAXZIDE-25) 37.5-25 MG tablet; Take 1 tablet by mouth daily.  Dispense: 30 tablet; Refill: 0  3. Hypercholesterolemia Stable. Diagnosis pulled for medication refill. Continue current medical treatment plan. - simvastatin (ZOCOR) 10 MG tablet; Take 1 tablet (10 mg total)  by mouth at bedtime.  Dispense: 90 tablet; Refill: 1   Return in about 3 months (around 01/13/2021) for CPE.      Delmer Islam, PA-C, have reviewed all documentation for this visit. The documentation on 10/14/20 for the exam, diagnosis, procedures, and orders are all accurate and complete.   Reine Just  [facility] [phone] (phone) [phone] (fax)  [facility]

## 2020-10-13 ENCOUNTER — Encounter: Payer: Self-pay | Admitting: Physician Assistant

## 2020-10-13 ENCOUNTER — Telehealth: Payer: Self-pay | Admitting: Physician Assistant

## 2020-10-13 ENCOUNTER — Ambulatory Visit: Payer: Managed Care, Other (non HMO) | Admitting: Physician Assistant

## 2020-10-13 ENCOUNTER — Other Ambulatory Visit: Payer: Self-pay

## 2020-10-13 VITALS — BP 127/84 | HR 106 | Temp 98.5°F | Resp 16 | Wt 251.8 lb

## 2020-10-13 DIAGNOSIS — I10 Essential (primary) hypertension: Secondary | ICD-10-CM | POA: Diagnosis not present

## 2020-10-13 DIAGNOSIS — E1169 Type 2 diabetes mellitus with other specified complication: Secondary | ICD-10-CM | POA: Diagnosis not present

## 2020-10-13 DIAGNOSIS — E119 Type 2 diabetes mellitus without complications: Secondary | ICD-10-CM

## 2020-10-13 DIAGNOSIS — E7800 Pure hypercholesterolemia, unspecified: Secondary | ICD-10-CM | POA: Diagnosis not present

## 2020-10-13 LAB — POCT GLYCOSYLATED HEMOGLOBIN (HGB A1C)
Est. average glucose Bld gHb Est-mCnc: 154
Hemoglobin A1C: 7 % — AB (ref 4.0–5.6)

## 2020-10-13 MED ORDER — SITAGLIPTIN PHOSPHATE 100 MG PO TABS: 100.0000 mg | ORAL_TABLET | Freq: Every day | ORAL | 1 refills | Status: DC

## 2020-10-13 MED ORDER — SIMVASTATIN 10 MG PO TABS: 10.0000 mg | ORAL_TABLET | Freq: Every day | ORAL | 1 refills | Status: DC

## 2020-10-13 MED ORDER — RAMIPRIL 2.5 MG PO CAPS: 2.5000 mg | ORAL_CAPSULE | Freq: Every day | ORAL | 1 refills | Status: DC

## 2020-10-13 MED ORDER — METFORMIN HCL 1000 MG PO TABS: 1000.0000 mg | ORAL_TABLET | Freq: Two times a day (BID) | ORAL | 1 refills | Status: DC

## 2020-10-13 MED ORDER — TRIAMTERENE-HCTZ 37.5-25 MG PO TABS: 1.0000 | ORAL_TABLET | Freq: Every day | ORAL | 0 refills | Status: DC

## 2020-10-13 NOTE — Telephone Encounter (Signed)
[facility] faxed refill request for the following medications:  metFORMIN (GLUCOPHAGE) 1000 MG tablet   sitaGLIPtin (JANUVIA) 100 MG tablet    Please advise.

## 2020-10-13 NOTE — Telephone Encounter (Signed)
[facility] is requesting this but I see you already sent them to [facility].

## 2020-10-13 NOTE — Telephone Encounter (Signed)
Updated and sent to [facility]

## 2020-10-13 NOTE — Patient Instructions (Signed)

## 2020-10-15 ENCOUNTER — Other Ambulatory Visit: Payer: Self-pay | Admitting: Physician Assistant

## 2020-10-15 ENCOUNTER — Other Ambulatory Visit: Payer: Self-pay | Admitting: Neurology

## 2020-10-15 DIAGNOSIS — E1169 Type 2 diabetes mellitus with other specified complication: Secondary | ICD-10-CM

## 2020-10-15 DIAGNOSIS — I10 Essential (primary) hypertension: Secondary | ICD-10-CM

## 2020-10-15 DIAGNOSIS — G379 Demyelinating disease of central nervous system, unspecified: Secondary | ICD-10-CM

## 2020-10-25 ENCOUNTER — Ambulatory Visit
Admission: RE | Admit: 2020-10-25 | Discharge: 2020-10-25 | Disposition: A | Discharge status: Home / Self Care | Payer: Managed Care, Other (non HMO) | Source: Ambulatory Visit | Attending: Neurology | Admitting: Neurology

## 2020-10-25 ENCOUNTER — Other Ambulatory Visit: Payer: Self-pay

## 2020-10-25 DIAGNOSIS — G379 Demyelinating disease of central nervous system, unspecified: Secondary | ICD-10-CM | POA: Diagnosis present

## 2020-10-25 MED ORDER — GADOBUTROL 1 MMOL/ML IV SOLN
10.0000 mL | Freq: Once | INTRAVENOUS | Status: AC | PRN
  Administered 2020-10-25: 10 mL via INTRAVENOUS

## 2020-10-27 LAB — HM DIABETES EYE EXAM

## 2020-11-03 ENCOUNTER — Other Ambulatory Visit: Payer: Self-pay | Admitting: Physician Assistant

## 2020-11-03 DIAGNOSIS — I10 Essential (primary) hypertension: Secondary | ICD-10-CM

## 2020-11-25 ENCOUNTER — Encounter: Payer: Self-pay | Admitting: Physician Assistant

## 2021-01-16 ENCOUNTER — Other Ambulatory Visit: Payer: Self-pay

## 2021-01-16 ENCOUNTER — Encounter: Payer: Self-pay | Admitting: Physician Assistant

## 2021-01-16 ENCOUNTER — Ambulatory Visit (INDEPENDENT_AMBULATORY_CARE_PROVIDER_SITE_OTHER): Payer: Managed Care, Other (non HMO) | Admitting: Physician Assistant

## 2021-01-16 VITALS — BP 115/75 | HR 105 | Temp 98.7°F | Ht 64.0 in | Wt 251.0 lb

## 2021-01-16 DIAGNOSIS — Z0000 Encounter for general adult medical examination without abnormal findings: Secondary | ICD-10-CM | POA: Diagnosis not present

## 2021-01-16 DIAGNOSIS — E785 Hyperlipidemia, unspecified: Secondary | ICD-10-CM

## 2021-01-16 DIAGNOSIS — E1169 Type 2 diabetes mellitus with other specified complication: Secondary | ICD-10-CM

## 2021-01-16 DIAGNOSIS — Z6841 Body Mass Index (BMI) 40.0 and over, adult: Secondary | ICD-10-CM

## 2021-01-16 DIAGNOSIS — Z1231 Encounter for screening mammogram for malignant neoplasm of breast: Secondary | ICD-10-CM | POA: Diagnosis not present

## 2021-01-16 DIAGNOSIS — Z23 Encounter for immunization: Secondary | ICD-10-CM

## 2021-01-16 DIAGNOSIS — I10 Essential (primary) hypertension: Secondary | ICD-10-CM

## 2021-01-16 NOTE — Progress Notes (Signed)
Complete physical exam   Patient: Carolyn Nolan   DOB: 15-Jul-1971   50 y.o. Female  MRN: [id number] Visit Date: 01/16/2021  Today's healthcare provider: Margaretann Loveless, PA-C   No chief complaint on file.  Subjective    Carolyn Nolan is a 50 y.o. female who presents today for a complete physical exam.  She reports consuming a general diet. The patient does not participate in regular exercise at present. She generally feels well. She reports sleeping well. She does not have additional problems to discuss today.  HPI    Past Medical History:  Diagnosis Date  . Diabetes mellitus without complication (HCC)   . Hypertension    Past Surgical History:  Procedure Laterality Date  . BREAST BIOPSY Left 2019   Keloid biopsy,benign   . COLONOSCOPY WITH PROPOFOL N/A 03/04/2020   Procedure: COLONOSCOPY WITH PROPOFOL;  Surgeon: Midge Minium, MD;  Location: [facility] ENDOSCOPY;  Service: Endoscopy;  Laterality: N/A;  PRIORITY 4   Social History   Socioeconomic History  . Marital status: Single    Spouse name: Not on file  . Number of children: Not on file  . Years of education: Not on file  . Highest education level: Not on file  Occupational History    Employer: [employer]  Tobacco Use  . Smoking status: Never Smoker  . Smokeless tobacco: Never Used  Vaping Use  . Vaping Use: Never used  Substance and Sexual Activity  . Alcohol use: Yes    Comment: Occasional Wine,none last 24hrs  . Drug use: No  . Sexual activity: Never  Other Topics Concern  . Not on file  Social History Narrative  . Not on file   Social Determinants of Health   Financial Resource Strain: Not on file  Food Insecurity: Not on file  Transportation Needs: Not on file  Physical Activity: Not on file  Stress: Not on file  Social Connections: Not on file  Intimate Partner Violence: Not on file   Family Status  Relation Name Status  . Mother  Alive  . Father  Deceased  . Sister  Alive   . Cousin  (Not Specified)  . Neg Hx  (Not Specified)   Family History  Problem Relation Age of Onset  . Diabetes Mother   . Diabetes Father   . Hypertension Father   . Healthy Sister   . Breast cancer Cousin   . Ovarian cancer Neg Hx    No Known Allergies  Patient Care Team: Reine Just as PCP - General (Family Medicine)   Medications: Outpatient Medications Prior to Visit  Medication Sig  . Ascorbic Acid (VITAMIN C) 1000 MG tablet Take 1,000 mg by mouth daily.  Marland Kitchen aspirin 81 MG tablet BAYER LOW STRENGTH, 81MG  (Oral Tablet Delayed Release)  1 Every Day for 0 days  Quantity: 0.00;  Refills: 0   Ordered :05-Oct-2010  07-Oct-2010 ;  Started 09-May-2007 Active Comments: DX: 250.00  . azelastine (ASTELIN) 0.1 % nasal spray instill 1 spray into each nostril twice a day  . benzoyl peroxide-erythromycin (BENZAMYCIN) gel APP TO FACE QAM UTD  . Calcium Carbonate-Vitamin D 600-200 MG-UNIT CAPS CALCIUM + D, 600-200MG -UNIT (Oral Tablet)  1 Twice Daily for 0 days  Quantity: 0.00;  Refills: 0   Ordered :05-Oct-2010  07-Oct-2010 ;  Started 03-July-2010 Active  . Cetirizine HCl 10 MG CAPS Take by mouth.  . cyclobenzaprine (FLEXERIL) 5 MG tablet TAKE 1 TABLET  BY MOUTH AT BEDTIME AS NEEDED FOR MUSCLE SPASMS  . ferrous sulfate 325 (65 FE) MG tablet Take by mouth.  . IRON-VITAMINS PO Take by mouth.  . metFORMIN (GLUCOPHAGE) 1000 MG tablet Take 1 tablet (1,000 mg total) by mouth 2 (two) times daily with a meal.  . montelukast (SINGULAIR) 10 MG tablet take 1 tablet by mouth at bedtime  . MULTIPLE VITAMIN PO MULTIVITAMINS (Oral Tablet)  1 Every Day for 0 days  Quantity: 0.00;  Refills: 0   Ordered :05-Oct-2010  Kavin Leech ;  Started 18-Mar-2010 Active Comments: DX: 250.00  . nortriptyline (PAMELOR) 25 MG capsule Take 50 mg by mouth.  . potassium gluconate 595 (99 K) MG TABS tablet Take by mouth.  . ramipril (ALTACE) 2.5 MG capsule Take 1 capsule (2.5 mg total) by mouth daily.  .  simvastatin (ZOCOR) 10 MG tablet Take 1 tablet (10 mg total) by mouth at bedtime.  . Tretinoin Microsphere 0.08 % GEL Apply topically at bedtime.  . triamterene-hydrochlorothiazide (MAXZIDE-25) 37.5-25 MG tablet TAKE 1 TABLET BY MOUTH EVERY DAY  . vitamin B-12 (CYANOCOBALAMIN) 1000 MCG tablet Take 1,000 mcg by mouth daily.  . Zinc 30 MG TABS Take by mouth.  . [DISCONTINUED] OZEMPIC, 0.25 OR 0.5 MG/DOSE, 2 MG/1.5ML SOPN INJECT 0.5MG  UNDER THE SKIN ONCE A WEEK  . [DISCONTINUED] sitaGLIPtin (JANUVIA) 100 MG tablet Take 1 tablet (100 mg total) by mouth daily.   No facility-administered medications prior to visit.    Review of Systems  Constitutional: Negative.   HENT: Positive for nosebleeds and sinus pressure. Negative for congestion, dental problem, drooling, ear discharge, ear pain, facial swelling, hearing loss, mouth sores, postnasal drip, rhinorrhea, sinus pain, sneezing, sore throat, tinnitus, trouble swallowing and voice change.   Eyes: Negative.   Respiratory: Negative.   Cardiovascular: Negative.   Gastrointestinal: Negative.   Endocrine: Negative.   Genitourinary: Negative.   Musculoskeletal: Negative.   Skin: Negative.   Allergic/Immunologic: Negative.   Neurological: Negative.   Hematological: Negative.   Psychiatric/Behavioral: Negative.       Objective    BP 115/75 (BP Location: Left Arm, Patient Position: Sitting, Cuff Size: Large)   Pulse (!) 105   Temp 98.7 F (37.1 C) (Oral)   Ht 5\' 4"  (1.626 m)   Wt 251 lb (113.9 kg)   BMI 43.08 kg/m    Physical Exam Vitals reviewed.  Constitutional:      General: She is not in acute distress.    Appearance: Normal appearance. She is well-developed and well-nourished. She is obese. She is not ill-appearing or diaphoretic.  HENT:     Head: Normocephalic and atraumatic.     Right Ear: Tympanic membrane, ear canal and external ear normal.     Left Ear: Tympanic membrane, ear canal and external ear normal.      Mouth/Throat:     Mouth: Oropharynx is clear and moist.  Eyes:     General: No scleral icterus.       Right eye: No discharge.        Left eye: No discharge.     Extraocular Movements: Extraocular movements intact and EOM normal.     Conjunctiva/sclera: Conjunctivae normal.     Pupils: Pupils are equal, round, and reactive to light.  Neck:     Thyroid: No thyromegaly.     Vascular: No carotid bruit or JVD.     Trachea: No tracheal deviation.  Cardiovascular:     Rate and Rhythm: Normal rate and regular rhythm.  Pulses: Normal pulses and intact distal pulses.     Heart sounds: Normal heart sounds. No murmur heard. No friction rub. No gallop.   Pulmonary:     Effort: Pulmonary effort is normal. No respiratory distress.     Breath sounds: Normal breath sounds. No wheezing or rales.  Chest:     Chest wall: No tenderness.  Abdominal:     General: Abdomen is flat. Bowel sounds are normal. There is no distension.     Palpations: Abdomen is soft. There is no mass.     Tenderness: There is no abdominal tenderness. There is no guarding or rebound.  Musculoskeletal:        General: No tenderness or edema. Normal range of motion.     Cervical back: Normal range of motion and neck supple. No tenderness.     Right lower leg: No edema.     Left lower leg: No edema.  Lymphadenopathy:     Cervical: No cervical adenopathy.  Skin:    General: Skin is warm and dry.     Capillary Refill: Capillary refill takes less than 2 seconds.     Findings: No rash.  Neurological:     General: No focal deficit present.     Mental Status: She is alert and oriented to person, place, and time. Mental status is at baseline.  Psychiatric:        Mood and Affect: Mood and affect and mood normal.        Behavior: Behavior normal.        Thought Content: Thought content normal.        Judgment: Judgment normal.       Last depression screening scores PHQ 2/9 Scores 01/16/2021 10/13/2020 01/14/2020  PHQ - 2  Score 1 2 1   PHQ- 9 Score 4 4 3    Last fall risk screening Fall Risk  01/16/2021  Falls in the past year? 0  Number falls in past yr: 0  Injury with Fall? 0  Risk for fall due to : No Fall Risks  Follow up Falls evaluation completed   Last Audit-C alcohol use screening Alcohol Use Disorder Test (AUDIT) 01/16/2021  1. How often do you have a drink containing alcohol? 1  2. How many drinks containing alcohol do you have on a typical day when you are drinking? 0  3. How often do you have six or more drinks on one occasion? 0  AUDIT-C Score 1  Alcohol Brief Interventions/Follow-up AUDIT Score <7 follow-up not indicated   A score of 3 or more in women, and 4 or more in men indicates increased risk for alcohol abuse, EXCEPT if all of the points are from question 1   Results for orders placed or performed in visit on 01/16/21  CBC w/Diff/Platelet  Result Value Ref Range   WBC 8.9 3.4 - 10.8 x10E3/uL   RBC 4.46 3.77 - 5.28 x10E6/uL   Hemoglobin 11.9 11.1 - 15.9 g/dL   Hematocrit 03/16/2021 03/16/21 - 46.6 %   MCV 86 79 - 97 fL   MCH 26.7 26.6 - 33.0 pg   MCHC 31.2 (L) 31.5 - 35.7 g/dL   RDW 35.4 65.6 - 81.2 %   Platelets 401 150 - 450 x10E3/uL   Neutrophils 67 Not Estab. %   Lymphs 24 Not Estab. %   Monocytes 8 Not Estab. %   Eos 1 Not Estab. %   Basos 0 Not Estab. %   Neutrophils Absolute 6.0 1.4 - 7.0  x10E3/uL   Lymphocytes Absolute 2.1 0.7 - 3.1 x10E3/uL   Monocytes Absolute 0.7 0.1 - 0.9 x10E3/uL   EOS (ABSOLUTE) 0.1 0.0 - 0.4 x10E3/uL   Basophils Absolute 0.0 0.0 - 0.2 x10E3/uL   Immature Granulocytes 0 Not Estab. %   Immature Grans (Abs) 0.0 0.0 - 0.1 x10E3/uL  Comprehensive Metabolic Panel (CMET)  Result Value Ref Range   Glucose 160 (H) 65 - 99 mg/dL   BUN 12 6 - 24 mg/dL   Creatinine, Ser 9.47 (H) 0.57 - 1.00 mg/dL   GFR calc non Af Amer 64 >59 mL/min/1.73   GFR calc Af Amer 74 >59 mL/min/1.73   BUN/Creatinine Ratio 12 9 - 23   Sodium 144 134 - 144 mmol/L   Potassium 4.4 3.5  - 5.2 mmol/L   Chloride 104 96 - 106 mmol/L   CO2 24 20 - 29 mmol/L   Calcium 9.7 8.7 - 10.2 mg/dL   Total Protein 7.6 6.0 - 8.5 g/dL   Albumin 4.4 3.8 - 4.8 g/dL   Globulin, Total 3.2 1.5 - 4.5 g/dL   Albumin/Globulin Ratio 1.4 1.2 - 2.2   Bilirubin Total <0.2 0.0 - 1.2 mg/dL   Alkaline Phosphatase 98 44 - 121 IU/L   AST 15 0 - 40 IU/L   ALT 23 0 - 32 IU/L  TSH  Result Value Ref Range   TSH 1.380 0.450 - 4.500 uIU/mL  Lipid Panel With LDL/HDL Ratio  Result Value Ref Range   Cholesterol, Total 163 100 - 199 mg/dL   Triglycerides 87 0 - 149 mg/dL   HDL 74 >65 mg/dL   VLDL Cholesterol Cal 16 5 - 40 mg/dL   LDL Chol Calc (NIH) 73 0 - 99 mg/dL   LDL/HDL Ratio 1.0 0.0 - 3.2 ratio  HgB A1c  Result Value Ref Range   Hgb A1c MFr Bld 7.5 (H) 4.8 - 5.6 %   Est. average glucose Bld gHb Est-mCnc 169 mg/dL    Assessment & Plan    Routine Health Maintenance and Physical Exam  Exercise Activities and Dietary recommendations Goals    . Exercise 150 minutes per week (moderate activity)       Immunization History  Administered Date(s) Administered  . Influenza-Unspecified 10/14/2015, 10/04/2017, 09/14/2018, 08/29/2019, 09/23/2020  . PFIZER(Purple Top)SARS-COV-2 Vaccination 02/14/2020, 03/06/2020, 10/06/2020  . Pneumococcal Polysaccharide-23 10/15/2011, 01/14/2020  . Td 01/03/2017  . Tdap 03/10/2006  . Zoster Recombinat (Shingrix) 01/16/2021    Health Maintenance  Topic Date Due  . Hepatitis C Screening  Never done  . FOOT EXAM  01/13/2021  . HEMOGLOBIN A1C  07/16/2021  . OPHTHALMOLOGY EXAM  10/27/2021  . PAP SMEAR-Modifier  01/27/2022  . MAMMOGRAM  02/11/2022  . TETANUS/TDAP  01/03/2027  . COLONOSCOPY (Pts 45-68yrs Insurance coverage will need to be confirmed)  03/04/2030  . INFLUENZA VACCINE  Completed  . PNEUMOCOCCAL POLYSACCHARIDE VACCINE AGE 66-64 HIGH RISK  Completed  . COVID-19 Vaccine  Completed  . HIV Screening  Completed    Discussed health benefits of physical  activity, and encouraged her to engage in regular exercise appropriate for her age and condition.  1. Annual physical exam Normal physical exam today. Will check labs as below and f/u pending lab results. If labs are stable and WNL she will not need to have these rechecked for one year at her next annual physical exam. She is to call the office in the meantime if she has any acute issue, questions or concerns.  2. Encounter for  screening mammogram for breast cancer There is no family history of breast cancer. She does perform regular self breast exams. Mammogram was ordered as below. [facility] patient so she may schedule her mammogram at her convenience. - MM 3D SCREEN BREAST BILATERAL  3. Primary hypertension Stable and well controlled. Continue Ramipril and Maxzide. Will check labs as below and f/u pending results. - CBC w/Diff/Platelet - Comprehensive Metabolic Panel (CMET) - TSH - Lipid Panel With LDL/HDL Ratio - HgB A1c  4. Hyperlipidemia associated with type 2 diabetes mellitus (HCC) Stable. Continue Simvastatin. Will check labs as below and f/u pending results. - CBC w/Diff/Platelet - Comprehensive Metabolic Panel (CMET) - TSH - Lipid Panel With LDL/HDL Ratio - HgB A1c  5. Type 2 diabetes mellitus with other specified complication, without long-term current use of insulin (HCC) Stable. Continue Metformin, Ozempic, Januvia. On Statin and ACE I. Will check labs as below and f/u pending results. - CBC w/Diff/Platelet - Comprehensive Metabolic Panel (CMET) - TSH - Lipid Panel With LDL/HDL Ratio - HgB A1c  6. Class 3 severe obesity due to excess calories with serious comorbidity and body mass index (BMI) of 40.0 to 44.9 in adult [facility](HCC) Counseled patient on healthy lifestyle modifications including dieting and exercise.  Will check labs as below and f/u pending results. - CBC w/Diff/Platelet - Comprehensive Metabolic Panel (CMET) - TSH -  Lipid Panel With LDL/HDL Ratio - HgB A1c  7. Need for shingles vaccine Shingrix Vaccine #1 given to patient without complications. Patient sat for 15 minutes after administration and was tolerated well without adverse effects. Return in 2 months for next injection.  - Varicella-zoster vaccine IM (Shingrix)   Return if symptoms worsen or fail to improve.     Delmer IslamI, Ashland Osmer M Damara Klunder, PA-C, have reviewed all documentation for this visit. The documentation on 02/03/21 for the exam, diagnosis, procedures, and orders are all accurate and complete.   Reine JustJennifer M Caralyn Twining, PA-C  [facility] [phone] (phone) [phone] (fax)  [facility]

## 2021-01-16 NOTE — Patient Instructions (Signed)
[facility] [address] [place],  [place]  [address] Main: [phone]   Preventive Care 40-51 Years Old, Female Preventive care refers to lifestyle choices and visits with your health care provider that can promote health and wellness. This includes:  A yearly physical exam. This is also called an annual wellness visit.  Regular dental and eye exams.  Immunizations.  Screening for certain conditions.  Healthy lifestyle choices, such as: ? Eating a healthy diet. ? Getting regular exercise. ? Not using drugs or products that contain nicotine and tobacco. ? Limiting alcohol use. What can I expect for my preventive care visit? Physical exam Your health care provider will check your:  Height and weight. These may be used to calculate your BMI (body mass index). BMI is a measurement that tells if you are at a healthy weight.  Heart rate and blood pressure.  Body temperature.  Skin for abnormal spots. Counseling Your health care provider may ask you questions about your:  Past medical problems.  Family's medical history.  Alcohol, tobacco, and drug use.  Emotional well-being.  Home life and relationship well-being.  Sexual activity.  Diet, exercise, and sleep habits.  Work and work environment.  Access to firearms.  Method of birth control.  Menstrual cycle.  Pregnancy history. What immunizations do I need? Vaccines are usually given at various ages, according to a schedule. Your health care provider will recommend vaccines for you based on your age, medical history, and lifestyle or other factors, such as travel or where you work.   What tests do I need? Blood tests  Lipid and cholesterol levels. These may be checked every 5 years, or more often if you are over 50 years old.  Hepatitis C test.  Hepatitis B test. Screening  Lung cancer screening. You may have this screening every year starting at age 55 if you  have a 30-pack-year history of smoking and currently smoke or have quit within the past 15 years.  Colorectal cancer screening. ? All adults should have this screening starting at age 50 and continuing until age 75. ? Your health care provider may recommend screening at age 45 if you are at increased risk. ? You will have tests every 1-10 years, depending on your results and the type of screening test.  Diabetes screening. ? This is done by checking your blood sugar (glucose) after you have not eaten for a while (fasting). ? You may have this done every 1-3 years.  Mammogram. ? This may be done every 1-2 years. ? Talk with your health care provider about when you should start having regular mammograms. This may depend on whether you have a family history of breast cancer.  BRCA-related cancer screening. This may be done if you have a family history of breast, ovarian, tubal, or peritoneal cancers.  Pelvic exam and Pap test. ? This may be done every 3 years starting at age 21. ? Starting at age 30, this may be done every 5 years if you have a Pap test in combination with an HPV test. Other tests  STD (sexually transmitted disease) testing, if you are at risk.  Bone density scan. This is done to screen for osteoporosis. You may have this scan if you are at high risk for osteoporosis. Talk with your health care provider about your test results, treatment options, and if necessary, the need for more tests. Follow these instructions at home: Eating and drinking  Eat a diet   that includes fresh fruits and vegetables, whole grains, lean protein, and low-fat dairy products.  Take vitamin and mineral supplements as recommended by your health care provider.  Do not drink alcohol if: ? Your health care provider tells you not to drink. ? You are pregnant, may be pregnant, or are planning to become pregnant.  If you drink alcohol: ? Limit how much you have to 0-1 drink a day. ? Be aware of  how much alcohol is in your drink. In the [place] one drink equals one 12 oz bottle of beer (355 mL), one 5 oz glass of wine (148 mL), or one 1 oz glass of hard liquor (44 mL).   Lifestyle  Take daily care of your teeth and gums. Brush your teeth every morning and night with fluoride toothpaste. Floss one time each day.  Stay active. Exercise for at least 30 minutes 5 or more days each week.  Do not use any products that contain nicotine or tobacco, such as cigarettes, e-cigarettes, and chewing tobacco. If you need help quitting, ask your health care provider.  Do not use drugs.  If you are sexually active, practice safe sex. Use a condom or other form of protection to prevent STIs (sexually transmitted infections).  If you do not wish to become pregnant, use a form of birth control. If you plan to become pregnant, see your health care provider for a prepregnancy visit.  If told by your health care provider, take low-dose aspirin daily starting at age 50.  Find healthy ways to cope with stress, such as: ? Meditation, yoga, or listening to music. ? Journaling. ? Talking to a trusted person. ? Spending time with friends and family. Safety  Always wear your seat belt while driving or riding in a vehicle.  Do not drive: ? If you have been drinking alcohol. Do not ride with someone who has been drinking. ? When you are tired or distracted. ? While texting.  Wear a helmet and other protective equipment during sports activities.  If you have firearms in your house, make sure you follow all gun safety procedures. What's next?  Visit your health care provider once a year for an annual wellness visit.  Ask your health care provider how often you should have your eyes and teeth checked.  Stay up to date on all vaccines. This information is not intended to replace advice given to you by your health care provider. Make sure you discuss any questions you have with your health care  provider. Document Revised: 09/02/2020 Document Reviewed: 08/10/2018 Elsevier Patient Education  2021 Elsevier Inc.  

## 2021-01-17 LAB — CBC WITH DIFFERENTIAL/PLATELET
Basophils Absolute: 0 10*3/uL (ref 0.0–0.2)
Basos: 0 %
EOS (ABSOLUTE): 0.1 10*3/uL (ref 0.0–0.4)
Eos: 1 %
Hematocrit: 38.2 % (ref 34.0–46.6)
Hemoglobin: 11.9 g/dL (ref 11.1–15.9)
Immature Grans (Abs): 0 10*3/uL (ref 0.0–0.1)
Immature Granulocytes: 0 %
Lymphocytes Absolute: 2.1 10*3/uL (ref 0.7–3.1)
Lymphs: 24 %
MCH: 26.7 pg (ref 26.6–33.0)
MCHC: 31.2 g/dL — ABNORMAL LOW (ref 31.5–35.7)
MCV: 86 fL (ref 79–97)
Monocytes Absolute: 0.7 10*3/uL (ref 0.1–0.9)
Monocytes: 8 %
Neutrophils Absolute: 6 10*3/uL (ref 1.4–7.0)
Neutrophils: 67 %
Platelets: 401 10*3/uL (ref 150–450)
RBC: 4.46 x10E6/uL (ref 3.77–5.28)
RDW: 12.9 % (ref 11.7–15.4)
WBC: 8.9 10*3/uL (ref 3.4–10.8)

## 2021-01-17 LAB — LIPID PANEL WITH LDL/HDL RATIO
Cholesterol, Total: 163 mg/dL (ref 100–199)
HDL: 74 mg/dL (ref >39)
LDL Chol Calc (NIH): 73 mg/dL (ref 0–99)
LDL/HDL Ratio: 1 ratio (ref 0.0–3.2)
Triglycerides: 87 mg/dL (ref 0–149)
VLDL Cholesterol Cal: 16 mg/dL (ref 5–40)

## 2021-01-17 LAB — COMPREHENSIVE METABOLIC PANEL
ALT: 23 IU/L (ref 0–32)
AST: 15 IU/L (ref 0–40)
Albumin/Globulin Ratio: 1.4 (ref 1.2–2.2)
Albumin: 4.4 g/dL (ref 3.8–4.8)
Alkaline Phosphatase: 98 IU/L (ref 44–121)
BUN/Creatinine Ratio: 12 (ref 9–23)
BUN: 12 mg/dL (ref 6–24)
Bilirubin Total: <0.2 mg/dL (ref 0.0–1.2)
CO2: 24 mmol/L (ref 20–29)
Calcium: 9.7 mg/dL (ref 8.7–10.2)
Chloride: 104 mmol/L (ref 96–106)
Creatinine, Ser: 1.02 mg/dL — ABNORMAL HIGH (ref 0.57–1.00)
GFR calc Af Amer: 74 mL/min/{1.73_m2} (ref >59)
GFR calc non Af Amer: 64 mL/min/{1.73_m2} (ref >59)
Globulin, Total: 3.2 g/dL (ref 1.5–4.5)
Glucose: 160 mg/dL — ABNORMAL HIGH (ref 65–99)
Potassium: 4.4 mmol/L (ref 3.5–5.2)
Sodium: 144 mmol/L (ref 134–144)
Total Protein: 7.6 g/dL (ref 6.0–8.5)

## 2021-01-17 LAB — TSH: TSH: 1.38 u[IU]/mL (ref 0.450–4.500)

## 2021-01-17 LAB — HEMOGLOBIN A1C
Est. average glucose Bld gHb Est-mCnc: 169 mg/dL
Hgb A1c MFr Bld: 7.5 % — ABNORMAL HIGH (ref 4.8–5.6)

## 2021-01-20 ENCOUNTER — Telehealth: Payer: Self-pay

## 2021-01-20 ENCOUNTER — Other Ambulatory Visit: Payer: Self-pay | Admitting: Physician Assistant

## 2021-01-20 DIAGNOSIS — E1169 Type 2 diabetes mellitus with other specified complication: Secondary | ICD-10-CM

## 2021-01-20 MED ORDER — SITAGLIPTIN PHOSPHATE 100 MG PO TABS: 100.0000 mg | ORAL_TABLET | Freq: Every day | ORAL | 1 refills | Status: DC

## 2021-01-20 NOTE — Telephone Encounter (Signed)
[facility]  Fax - [phone]  Pharmacy faxed refill request for the following medications:  Requesting 90 day supply sitaGLIPtin (JANUVIA) 100 MG tablet   Please advise. Thanks, [employer]

## 2021-01-20 NOTE — Progress Notes (Signed)
Januvia refilled

## 2021-01-26 ENCOUNTER — Encounter: Payer: Self-pay | Admitting: Physician Assistant

## 2021-01-26 DIAGNOSIS — E1169 Type 2 diabetes mellitus with other specified complication: Secondary | ICD-10-CM

## 2021-01-26 MED ORDER — OZEMPIC (1 MG/DOSE) 2 MG/1.5ML ~~LOC~~ SOPN: 1.0000 mg | PEN_INJECTOR | SUBCUTANEOUS | 3 refills | Status: DC

## 2021-01-30 NOTE — Telephone Encounter (Signed)
Can we try PA again. She has been on this medication just increasing the dose

## 2021-01-30 NOTE — Telephone Encounter (Signed)
PA done today. Waiting on Response.

## 2021-02-12 ENCOUNTER — Other Ambulatory Visit: Payer: Self-pay

## 2021-02-12 ENCOUNTER — Ambulatory Visit
Admission: RE | Admit: 2021-02-12 | Discharge: 2021-02-12 | Disposition: A | Discharge status: Home / Self Care | Payer: Managed Care, Other (non HMO) | Source: Ambulatory Visit | Attending: Physician Assistant | Admitting: Physician Assistant

## 2021-02-12 DIAGNOSIS — Z1231 Encounter for screening mammogram for malignant neoplasm of breast: Secondary | ICD-10-CM | POA: Diagnosis not present

## 2021-02-23 ENCOUNTER — Other Ambulatory Visit: Payer: Self-pay | Admitting: Physician Assistant

## 2021-02-23 DIAGNOSIS — I10 Essential (primary) hypertension: Secondary | ICD-10-CM

## 2021-03-16 ENCOUNTER — Telehealth: Payer: Self-pay

## 2021-03-16 ENCOUNTER — Encounter: Payer: Self-pay | Admitting: Physician Assistant

## 2021-03-16 DIAGNOSIS — E1169 Type 2 diabetes mellitus with other specified complication: Secondary | ICD-10-CM

## 2021-03-16 MED ORDER — SITAGLIPTIN PHOSPHATE 100 MG PO TABS: 100.0000 mg | ORAL_TABLET | Freq: Every day | ORAL | 1 refills | Status: DC

## 2021-03-16 NOTE — Telephone Encounter (Signed)
[facility] faxed refill request for the following medications:  sitaGLIPtin (JANUVIA) 100 MG tablet   Please advise. 

## 2021-03-17 NOTE — Telephone Encounter (Signed)
No availability this Friday. Scheduled appt on 03/27/21 @ 3:20.

## 2021-03-24 ENCOUNTER — Other Ambulatory Visit: Payer: Self-pay | Admitting: Physician Assistant

## 2021-03-24 DIAGNOSIS — E7800 Pure hypercholesterolemia, unspecified: Secondary | ICD-10-CM

## 2021-03-24 NOTE — Telephone Encounter (Signed)
Requested medications are due for refill today.  yes  Requested medications are on the active medications list.  yes  Last refill. 10/13/2020  Future visit scheduled.   Yes - With Dr. Sherrie Mustache  Notes to clinic.  Was a pt of Joycelyn Man - She signed Rx.

## 2021-03-27 ENCOUNTER — Other Ambulatory Visit: Payer: Self-pay

## 2021-03-27 ENCOUNTER — Ambulatory Visit (INDEPENDENT_AMBULATORY_CARE_PROVIDER_SITE_OTHER): Payer: Managed Care, Other (non HMO) | Admitting: Family Medicine

## 2021-03-27 ENCOUNTER — Encounter: Payer: Self-pay | Admitting: Family Medicine

## 2021-03-27 DIAGNOSIS — Z23 Encounter for immunization: Secondary | ICD-10-CM

## 2021-03-27 NOTE — Progress Notes (Signed)
Nurse visit only. Administered 2nd dose of Shingrix vaccine. Patient tolerated without difficulty

## 2021-04-13 ENCOUNTER — Other Ambulatory Visit: Payer: Self-pay | Admitting: Physician Assistant

## 2021-04-13 DIAGNOSIS — I10 Essential (primary) hypertension: Secondary | ICD-10-CM

## 2021-04-24 ENCOUNTER — Encounter: Payer: Self-pay | Admitting: Physician Assistant

## 2021-04-24 DIAGNOSIS — E1169 Type 2 diabetes mellitus with other specified complication: Secondary | ICD-10-CM

## 2021-04-24 MED ORDER — METFORMIN HCL 1000 MG PO TABS: 1000.0000 mg | ORAL_TABLET | Freq: Two times a day (BID) | ORAL | 1 refills | Status: DC

## 2021-04-24 NOTE — Telephone Encounter (Signed)
Ok to refill metformin and schedule COVID vax appt

## 2021-04-27 ENCOUNTER — Telehealth: Payer: Self-pay | Admitting: Physician Assistant

## 2021-04-27 NOTE — Telephone Encounter (Signed)
Pt is calling to schedule her second booster. Attempted to reach the office no answer. CB- [phone]

## 2021-04-29 ENCOUNTER — Other Ambulatory Visit: Payer: Self-pay

## 2021-04-29 ENCOUNTER — Ambulatory Visit (INDEPENDENT_AMBULATORY_CARE_PROVIDER_SITE_OTHER): Payer: Managed Care, Other (non HMO)

## 2021-04-29 DIAGNOSIS — Z23 Encounter for immunization: Secondary | ICD-10-CM | POA: Diagnosis not present

## 2021-06-01 ENCOUNTER — Other Ambulatory Visit: Payer: Self-pay | Admitting: Physician Assistant

## 2021-06-01 DIAGNOSIS — I10 Essential (primary) hypertension: Secondary | ICD-10-CM

## 2021-06-03 ENCOUNTER — Other Ambulatory Visit: Payer: Self-pay | Admitting: Physician Assistant

## 2021-06-03 DIAGNOSIS — I10 Essential (primary) hypertension: Secondary | ICD-10-CM

## 2021-06-03 MED ORDER — TRIAMTERENE-HCTZ 37.5-25 MG PO TABS: 1.0000 | ORAL_TABLET | Freq: Every day | ORAL | 0 refills | Status: DC

## 2021-07-09 ENCOUNTER — Other Ambulatory Visit: Payer: Self-pay | Admitting: Family Medicine

## 2021-07-09 DIAGNOSIS — I10 Essential (primary) hypertension: Secondary | ICD-10-CM

## 2021-07-09 NOTE — Telephone Encounter (Signed)
Requested medications are due for refill today.  yes  Requested medications are on the active medications list.  yes  Last refill. 06/03/2021  Future visit scheduled.   yes  Notes to clinic.  Rx refill needs over rides for medication interactions. Please advise.

## 2021-08-17 ENCOUNTER — Other Ambulatory Visit: Payer: Self-pay | Admitting: Family Medicine

## 2021-08-17 DIAGNOSIS — I10 Essential (primary) hypertension: Secondary | ICD-10-CM

## 2021-08-20 ENCOUNTER — Other Ambulatory Visit: Payer: Self-pay | Admitting: Family Medicine

## 2021-08-20 DIAGNOSIS — I10 Essential (primary) hypertension: Secondary | ICD-10-CM

## 2021-08-29 IMAGING — MR MR HEAD WO/W CM
17 series · 48 of 48 positions shown · IV contrast (10ml Gadavist)
Comparison: None.

CLINICAL DATA: Headaches.  Demyelinating disease.

EXAM:
MRI HEAD WITHOUT AND WITH CONTRAST
TECHNIQUE: Multiplanar, multiecho pulse sequences of the brain and surrounding
structures were obtained without and with intravenous contrast.
CONTRAST:  10mL GADAVIST GADOBUTROL 1 MMOL/ML IV SOLN

[Series 5: ax dwi_tracew · axial · 3.0mm · 0.60mm/px · z∈[-101,+54]mm · 5 of 96 slices shown]
[im 1/96]
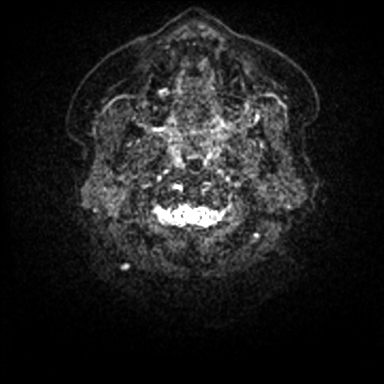
[im 24/96]
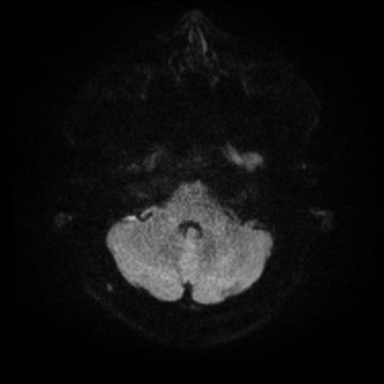
[im 48/96]
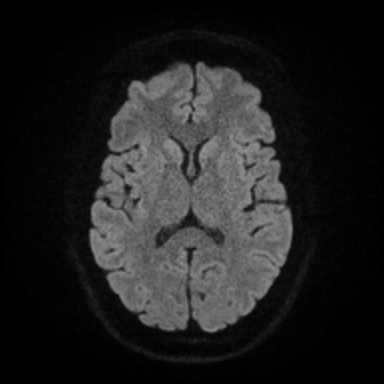
[im 72/96]
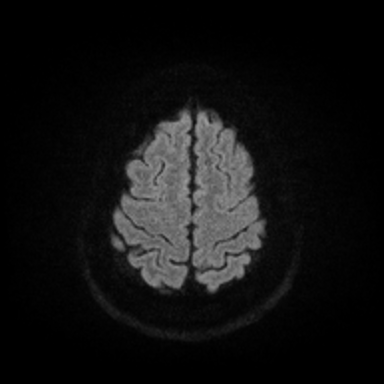
[im 96/96]
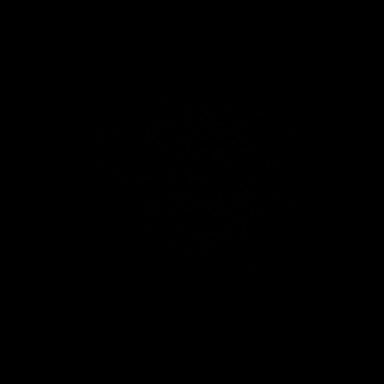

[Series 6: ax dwi_adc · axial · 3.0mm · 0.60mm/px · z∈[-101,+41]mm · 2 of 44 slices shown]
[im 1/44]
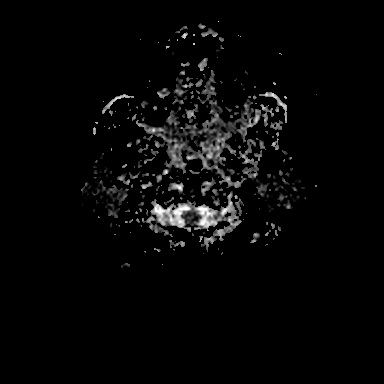
[im 44/44]
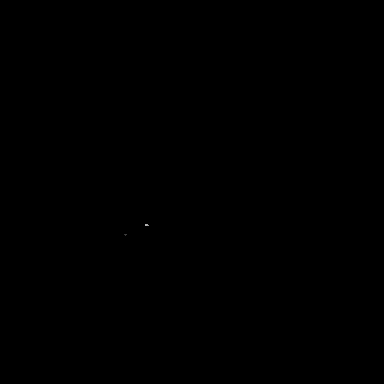

[Series 7: cor dwi_tracew · coronal · 5.0mm · 0.68mm/px · 4 of 80 slices shown]
[im 1/80]
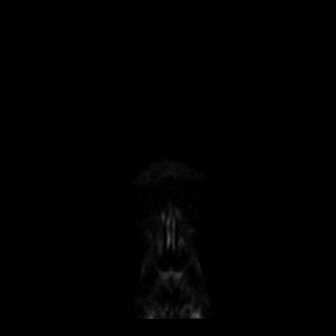
[im 27/80]
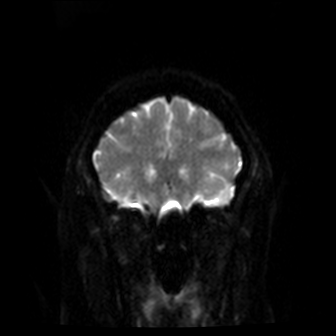
[im 53/80]
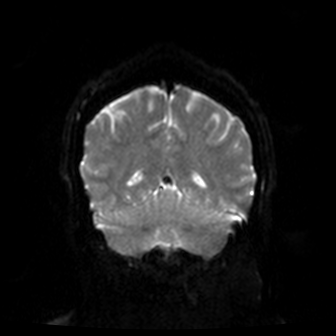
[im 80/80]
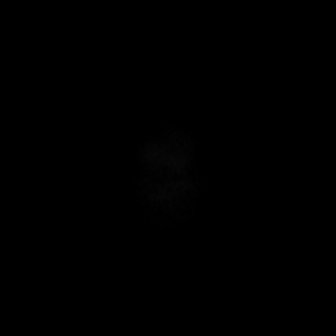

[Series 8: cor dwi_adc · coronal · 5.0mm · 0.68mm/px · 2 of 40 slices shown]
[im 1/40]
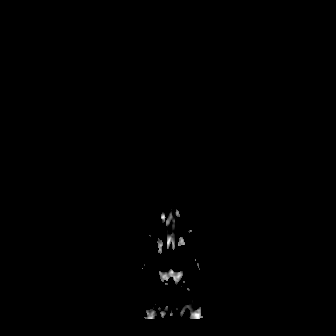
[im 40/40]
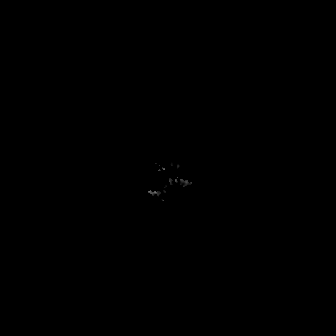

[Series 9: T1 · sagittal · 5.0mm · 0.62mm/px · 1 of 25 slices shown (1 of 2)]
[im 1/25]
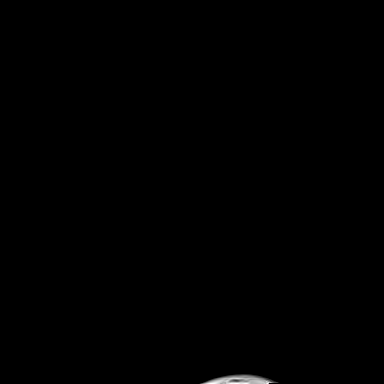

[Series 10: FLAIR · sagittal · 5.0mm · 0.94mm/px · 1 of 25 slices shown (1 of 2)]
[im 1/25]
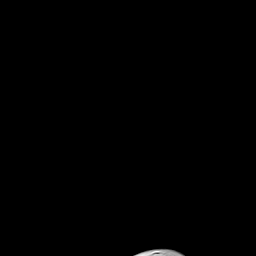

[Series 11: T2 · axial · 5.0mm · 0.53mm/px · 1 of 27 slices shown]
[im 1/27]
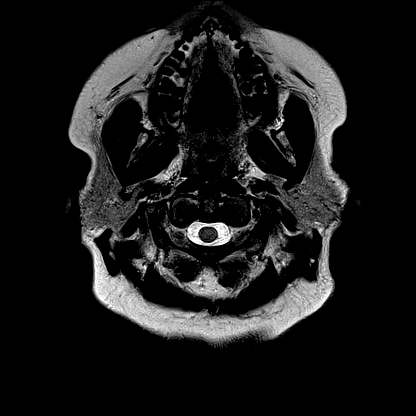

[Series 12: mag_images · axial · 3.0mm · 0.90mm/px · z∈[-112,+65]mm · 3 of 60 slices shown]
[im 1/60]
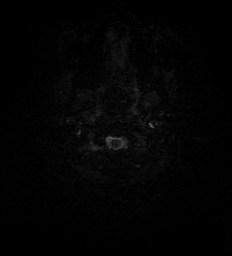
[im 30/60]
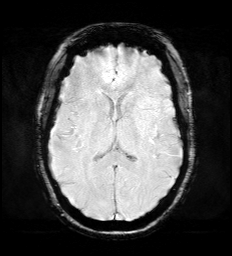
[im 60/60]
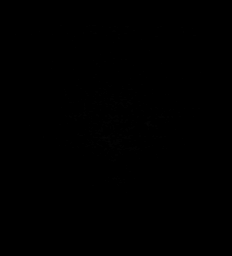

[Series 13: pha_images · axial · 3.0mm · 0.90mm/px · z∈[-112,+62]mm · 3 of 58 slices shown]
[im 1/58]
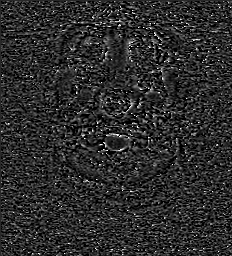
[im 29/58]
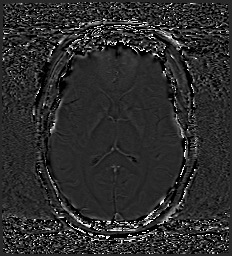
[im 58/58]
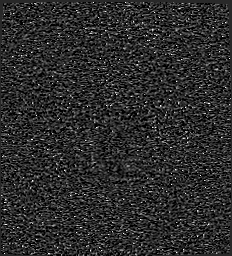

[Series 14: swi_images · axial · 3.0mm · 0.90mm/px · z∈[-112,+65]mm · 3 of 60 slices shown]
[im 1/60]
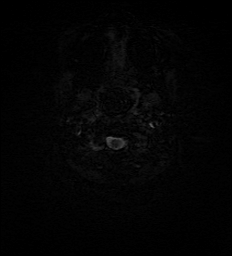
[im 30/60]
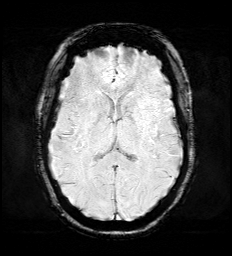
[im 60/60]
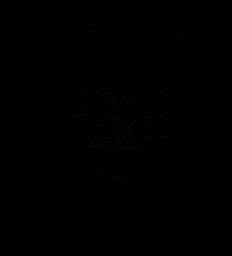

[Series 15: mip_images(sw) · axial · 24.0mm · 0.90mm/px · z∈[-102,+54]mm · 2 of 53 slices shown]
[im 1/53]
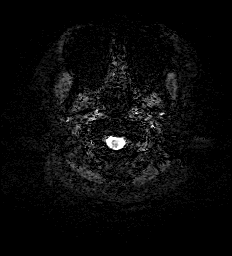
[im 53/53]
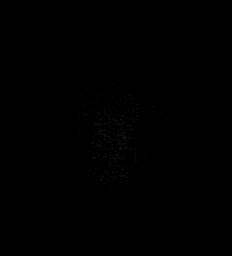

[Series 16: FLAIR · axial · 3.0mm · 0.53mm/px · z∈[-104,+58]mm · 2 of 55 slices shown (2 of 2)]
[im 1/55]
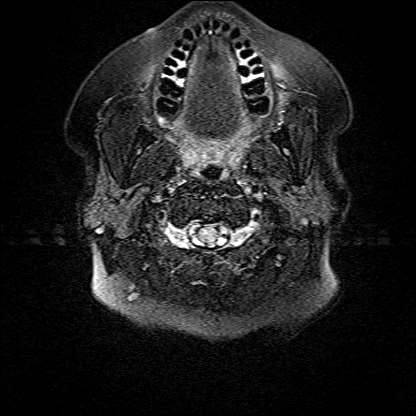
[im 55/55]
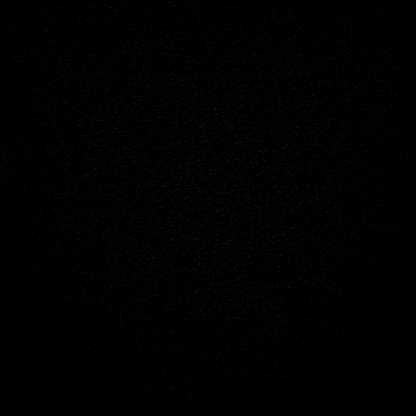

[Series 17: T1 · axial · 1.0mm · 0.98mm/px · z∈[-116,+59]mm · 8 of 176 slices shown (2 of 2)]
[im 1/176]
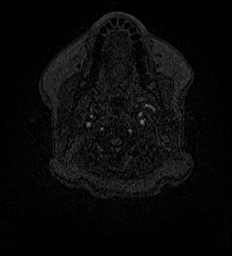
[im 26/176]
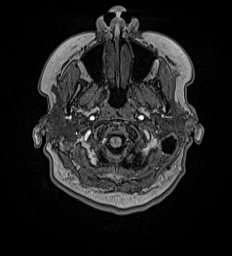
[im 51/176]
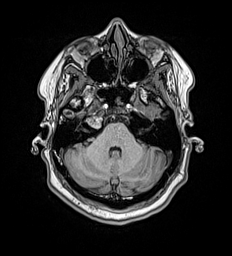
[im 76/176]
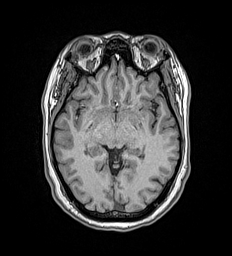
[im 101/176]
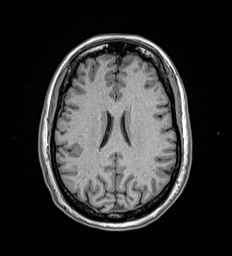
[im 126/176]
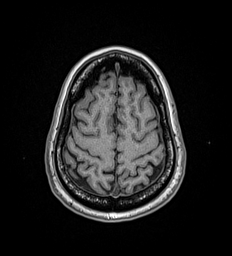
[im 151/176]
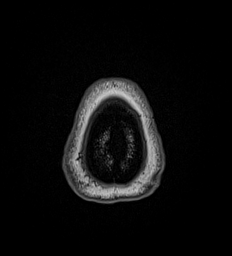
[im 176/176]
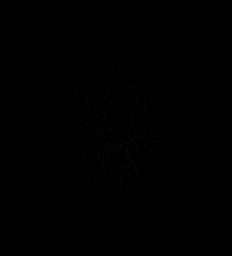

[Series 18: T2 post-contrast · coronal · 5.0mm · 0.57mm/px · 1 of 29 slices shown]
[im 1/29]
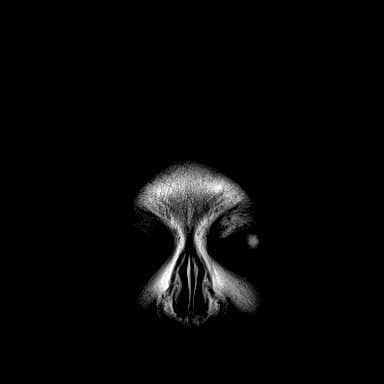

[Series 19: T1 post-contrast · axial · 1.0mm · 0.98mm/px · z∈[-116,+59]mm · 8 of 176 slices shown (1 of 3)]
[im 1/176]
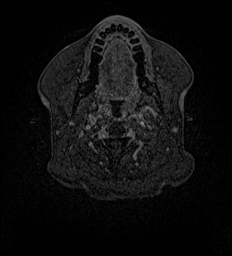
[im 26/176]
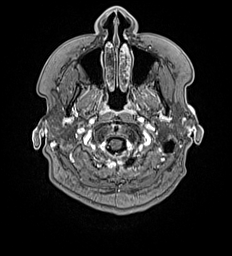
[im 51/176]
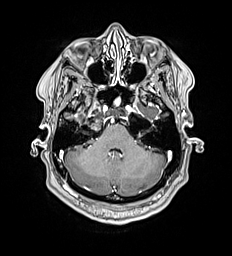
[im 76/176]
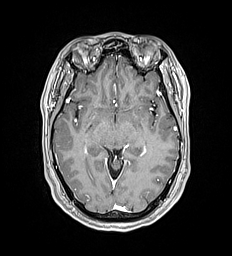
[im 101/176]
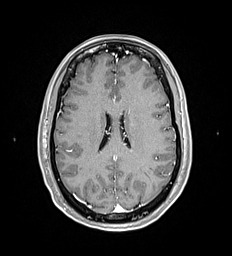
[im 126/176]
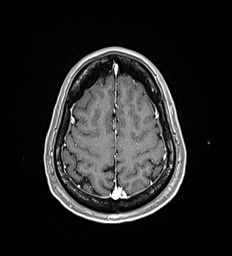
[im 151/176]
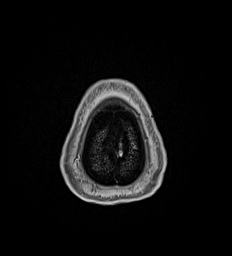
[im 176/176]
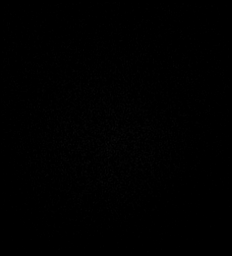

[Series 20: T1 post-contrast · coronal · 5.0mm · 0.57mm/px · 1 of 29 slices shown (2 of 3)]
[im 1/29]
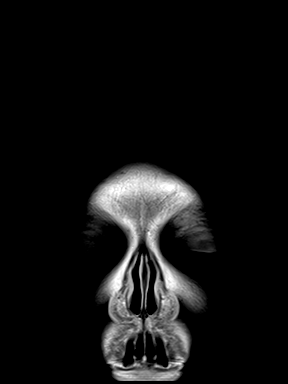

[Series 21: T1 post-contrast · sagittal · 5.0mm · 0.62mm/px · 1 of 25 slices shown (3 of 3)]
[im 1/25]
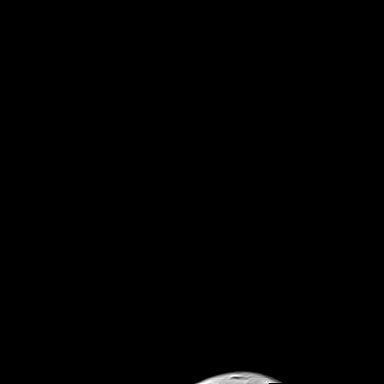

[48 of 48 positions shown; findings below may reference images not displayed]

FINDINGS: Brain: There is no evidence of an acute infarct, intracranial
hemorrhage, mass, midline shift, or extra-axial fluid collection.
The ventricles and sulci are normal. A few punctate foci of T2
hyperintensity in the subcortical and deep cerebral white matter are
nonspecific and not considered to be abnormal for age. There are no
specific findings of demyelinating disease. There is an enlarged
partially empty sella. No abnormal enhancement is identified. The
cerebellar tonsils are normally positioned.

Vascular: Major intracranial vascular flow voids are preserved. The
major dural venous sinuses are enhancing.

Skull and upper cervical spine: Unremarkable bone marrow signal.

Sinuses/Orbits: Unremarkable orbits. Paranasal sinuses and mastoid
air cells are clear.

Other: None.
IMPRESSION: 1. Partially empty sella, often an incidental finding though can be
seen with idiopathic intracranial hypertension.
2. Otherwise unremarkable appearance of the brain for age.

## 2021-09-21 ENCOUNTER — Other Ambulatory Visit: Payer: Self-pay | Admitting: Physician Assistant

## 2021-09-21 DIAGNOSIS — E1129 Type 2 diabetes mellitus with other diabetic kidney complication: Secondary | ICD-10-CM

## 2021-09-21 NOTE — Telephone Encounter (Signed)
Ok to send it with 0 refills, but needs to re-establish care with a new provider in the office

## 2021-09-22 ENCOUNTER — Other Ambulatory Visit: Payer: Self-pay | Admitting: Physician Assistant

## 2021-09-22 DIAGNOSIS — E1129 Type 2 diabetes mellitus with other diabetic kidney complication: Secondary | ICD-10-CM

## 2021-09-23 ENCOUNTER — Other Ambulatory Visit: Payer: Self-pay | Admitting: Family Medicine

## 2021-09-23 DIAGNOSIS — I10 Essential (primary) hypertension: Secondary | ICD-10-CM

## 2021-09-23 MED ORDER — TRIAMTERENE-HCTZ 37.5-25 MG PO TABS: 1.0000 | ORAL_TABLET | Freq: Every day | ORAL | 0 refills | Status: DC

## 2021-09-24 NOTE — Telephone Encounter (Signed)
Darl Pikes at [facility] needs clarifications on dircetions on the prescription   CB#  [phone]

## 2021-09-30 ENCOUNTER — Encounter: Payer: Self-pay | Admitting: Family Medicine

## 2021-10-01 ENCOUNTER — Telehealth: Payer: Self-pay

## 2021-10-01 ENCOUNTER — Telehealth: Payer: Self-pay | Admitting: *Deleted

## 2021-10-01 DIAGNOSIS — E1169 Type 2 diabetes mellitus with other specified complication: Secondary | ICD-10-CM

## 2021-10-01 NOTE — Telephone Encounter (Signed)
Copied from CRM [id number]. Topic: Appointment Scheduling - Scheduling Inquiry for Clinic >> Oct 01, 2021 11:03 AM [name] wrote: Reason for CRM: Pt called in stating she had gotten a message from PCP about coming in to get A1c checked, there were no appts available, and pt wants to know if she can get squeezed in somewhere or possible get labs ordered in her chart to go and get them somewhere else, please advise.

## 2021-10-01 NOTE — Telephone Encounter (Signed)
This is a [place] patient that needs to be established with one of the new APPs. Please find her an appointment with them for this routine follow-up. Needs an actual appointment, not just labs

## 2021-10-02 MED ORDER — OZEMPIC (1 MG/DOSE) 2 MG/1.5ML ~~LOC~~ SOPN: 1.0000 mg | PEN_INJECTOR | SUBCUTANEOUS | 3 refills | Status: DC

## 2021-10-02 NOTE — Telephone Encounter (Signed)
It looks like she is on the 1mg  weekly dose and the wrong Rx was sent in. Please resend the 1mg  dose from her med list

## 2021-10-02 NOTE — Telephone Encounter (Signed)
Lmtcb to schedule an office visit with Robynn Pane.

## 2021-10-10 ENCOUNTER — Other Ambulatory Visit: Payer: Self-pay | Admitting: Family Medicine

## 2021-10-10 DIAGNOSIS — I10 Essential (primary) hypertension: Secondary | ICD-10-CM

## 2021-10-10 NOTE — Telephone Encounter (Signed)
Requested medication (s) are due for refill today: yes  Requested medication (s) are on the active medication list: yes  Last refill:  04/13/21 #90 1 RF  Future visit scheduled: yes  Notes to clinic:  overdue lab work   Requested Prescriptions  Pending Prescriptions Disp Refills   ramipril (ALTACE) 2.5 MG capsule [Pharmacy Med Name: RAMIPRIL 2.5MG  CAPSULES] 90 capsule 1    Sig: TAKE 1 CAPSULE(2.5 MG) BY MOUTH DAILY     Cardiovascular:  ACE Inhibitors Failed - 10/10/2021  7:08 AM      Failed - Cr in normal range and within 180 days    Creatinine, Ser  Date Value Ref Range Status  01/16/2021 1.02 (H) 0.57 - 1.00 mg/dL Final          Failed - K in normal range and within 180 days    Potassium  Date Value Ref Range Status  01/16/2021 4.4 3.5 - 5.2 mmol/L Final          Failed - Valid encounter within last 6 months    Recent Outpatient Visits           6 months ago Need for shingles vaccine   [facility] Malva Limes, [place]   8 months ago Annual physical exam   [facility] Joycelyn Man M, [place]   12 months ago Type 2 diabetes mellitus with other specified complication, without long-term current use of insulin [facility])   [facility] [place], [place], [place]   1 year ago Annual physical exam   [facility] [place], Alessandra Bevel[place], [place]   2 years ago Class 3 severe obesity due to excess calories with serious comorbidity and body mass index (BMI) of 40.0 to 44.9 in adult [facility])   [facility] [place], Alessandra [place], [place]       Future Appointments             In 5 days Jacky Kindle, FNP [facility], [facility]   In 4 months Bacigalupo, Marzella Schlein, MD [facility], [facility]            Passed - Patient is not pregnant      Passed - Last BP in normal range    BP Readings from Last 1 Encounters:  01/16/21 115/75

## 2021-10-15 ENCOUNTER — Other Ambulatory Visit: Payer: Self-pay

## 2021-10-15 ENCOUNTER — Ambulatory Visit: Payer: Managed Care, Other (non HMO) | Admitting: Family Medicine

## 2021-10-15 ENCOUNTER — Encounter: Payer: Self-pay | Admitting: Family Medicine

## 2021-10-15 VITALS — BP 139/80 | HR 114 | Resp 16 | Wt 250.6 lb

## 2021-10-15 DIAGNOSIS — I10 Essential (primary) hypertension: Secondary | ICD-10-CM

## 2021-10-15 DIAGNOSIS — E1169 Type 2 diabetes mellitus with other specified complication: Secondary | ICD-10-CM | POA: Diagnosis not present

## 2021-10-15 DIAGNOSIS — I152 Hypertension secondary to endocrine disorders: Secondary | ICD-10-CM | POA: Insufficient documentation

## 2021-10-15 DIAGNOSIS — E7800 Pure hypercholesterolemia, unspecified: Secondary | ICD-10-CM

## 2021-10-15 DIAGNOSIS — E1159 Type 2 diabetes mellitus with other circulatory complications: Secondary | ICD-10-CM | POA: Insufficient documentation

## 2021-10-15 DIAGNOSIS — E785 Hyperlipidemia, unspecified: Secondary | ICD-10-CM

## 2021-10-15 DIAGNOSIS — Z6841 Body Mass Index (BMI) 40.0 and over, adult: Secondary | ICD-10-CM

## 2021-10-15 LAB — POCT GLYCOSYLATED HEMOGLOBIN (HGB A1C): Hemoglobin A1C: 8.2 % — AB (ref 4.0–5.6)

## 2021-10-15 MED ORDER — RYBELSUS 3 MG PO TABS: 3.0000 mg | ORAL_TABLET | Freq: Every day | ORAL | 3 refills | Status: DC

## 2021-10-15 MED ORDER — TRIAMTERENE-HCTZ 37.5-25 MG PO TABS: 1.0000 | ORAL_TABLET | Freq: Every day | ORAL | 3 refills | Status: DC

## 2021-10-15 MED ORDER — RAMIPRIL 2.5 MG PO CAPS: 2.5000 mg | ORAL_CAPSULE | Freq: Every day | ORAL | 3 refills | Status: DC

## 2021-10-15 MED ORDER — SIMVASTATIN 10 MG PO TABS: 10.0000 mg | ORAL_TABLET | Freq: Every day | ORAL | 3 refills | Status: DC

## 2021-10-15 NOTE — Progress Notes (Signed)
Established patient visit   Patient: Carolyn Nolan   DOB: Aug 06, 1971   50 y.o. Female  MRN: [id number] Visit Date: 10/15/2021  Today's healthcare provider: Jacky Kindle, FNP   Chief Complaint  Patient presents with   Diabetes   Hypertension   Hyperlipidemia   Subjective    HPI  Diabetes Mellitus Type II, follow-up  Lab Results  Component Value Date   HGBA1C 8.2 (A) 10/15/2021   HGBA1C 7.5 (H) 01/16/2021   HGBA1C 7.0 (A) 10/13/2020   Last seen for diabetes 8 months ago.  Management since then includes continuing the same treatment. She reports fair compliance with treatment. Patient reports that she has been out of Ozempic for a month She is not having side effects.   Home blood sugar records:  not being checked  Episodes of hypoglycemia? No    Current insulin regiment: none Most Recent Eye Exam: 11/09/2021  --------------------------------------------------------------------------------------------------- Hypertension, follow-up  BP Readings from Last 3 Encounters:  10/15/21 139/80  01/16/21 115/75  10/13/20 127/84   Wt Readings from Last 3 Encounters:  10/15/21 250 lb 9.6 oz (113.7 kg)  01/16/21 251 lb (113.9 kg)  10/13/20 251 lb 12.8 oz (114.2 kg)     She was last seen for hypertension 8 months ago.  BP at that visit was 115/75. Management since that visit includes none. She reports excellent compliance with treatment. She is not having side effects.  She is not exercising. She is not adherent to low salt diet.   Outside blood pressures are not being checked .  She does not smoke.  Use of agents associated with hypertension: none.   --------------------------------------------------------------------------------------------------- Lipid/Cholesterol, follow-up  Last Lipid Panel: Lab Results  Component Value Date   CHOL 163 01/16/2021   LDLCALC 73 01/16/2021   HDL 74 01/16/2021   TRIG 87 01/16/2021    She was last seen for this 8  months ago.  Management since that visit includes none.  She reports fair compliance with treatment.Patient reports that she ran out of medication 2 weeks ago She  is not  having side effects.   Symptoms: No appetite changes No foot ulcerations  No chest pain No chest pressure/discomfort  No dyspnea No orthopnea  No fatigue Yes lower extremity edema  No palpitations No paroxysmal nocturnal dyspnea  No nausea No numbness or tingling of extremity  No polydipsia No polyuria  No speech difficulty No syncope   She is following a Regular diet. Current exercise: no regular exercise  Last metabolic panel Lab Results  Component Value Date   GLUCOSE 160 (H) 01/16/2021   NA 144 01/16/2021   K 4.4 01/16/2021   BUN 12 01/16/2021   CREATININE 1.02 (H) 01/16/2021   GFRNONAA 64 01/16/2021   CALCIUM 9.7 01/16/2021   AST 15 01/16/2021   ALT 23 01/16/2021   The 10-year ASCVD risk score (Arnett DK, et al., 2019) is: 6.8%  ---------------------------------------------------------------------------------------------------     Medications: Outpatient Medications Prior to Visit  Medication Sig   amoxicillin (AMOXIL) 500 MG tablet Take 500 mg by mouth 3 (three) times daily.   Ascorbic Acid (VITAMIN C) 1000 MG tablet Take 1,000 mg by mouth daily.   aspirin 81 MG tablet BAYER LOW STRENGTH, 81MG  (Oral Tablet Delayed Release)  1 Every Day for 0 days  Quantity: 0.00;  Refills: 0   Ordered :05-Oct-2010  07-Oct-2010 ;  Started 09-May-2007 Active Comments: DX: 250.00   azelastine (ASTELIN) 0.1 % nasal spray instill  1 spray into each nostril twice a day   benzoyl peroxide-erythromycin (BENZAMYCIN) gel APP TO FACE QAM UTD   Calcium Carbonate-Vitamin D 600-200 MG-UNIT CAPS CALCIUM + D, 600-200MG -UNIT (Oral Tablet)  1 Twice Daily for 0 days  Quantity: 0.00;  Refills: 0   Ordered :05-Oct-2010  Kavin Leech ;  Started 03-July-2010 Active   Cetirizine HCl 10 MG CAPS Take by mouth.   cyclobenzaprine  (FLEXERIL) 5 MG tablet TAKE 1 TABLET BY MOUTH AT BEDTIME AS NEEDED FOR MUSCLE SPASMS   ferrous sulfate 325 (65 FE) MG tablet Take by mouth.   HYDROcodone-acetaminophen (NORCO) 10-325 MG tablet Take 1 tablet by mouth every 6 (six) hours as needed.   ibuprofen (ADVIL) 800 MG tablet Take 800 mg by mouth every 8 (eight) hours as needed.   IRON-VITAMINS PO Take by mouth.   metFORMIN (GLUCOPHAGE) 1000 MG tablet Take 1 tablet (1,000 mg total) by mouth 2 (two) times daily with a meal.   montelukast (SINGULAIR) 10 MG tablet take 1 tablet by mouth at bedtime   MULTIPLE VITAMIN PO MULTIVITAMINS (Oral Tablet)  1 Every Day for 0 days  Quantity: 0.00;  Refills: 0   Ordered :05-Oct-2010  Kavin Leech ;  Started 18-Mar-2010 Active Comments: DX: 250.00   nortriptyline (PAMELOR) 25 MG capsule Take 50 mg by mouth.   potassium gluconate 595 (99 K) MG TABS tablet Take by mouth.   sitaGLIPtin (JANUVIA) 100 MG tablet Take 1 tablet (100 mg total) by mouth daily.   Tretinoin Microsphere 0.08 % GEL Apply topically at bedtime.   vitamin B-12 (CYANOCOBALAMIN) 1000 MCG tablet Take 1,000 mcg by mouth daily.   Zinc 30 MG TABS Take by mouth.   [DISCONTINUED] ramipril (ALTACE) 2.5 MG capsule TAKE 1 CAPSULE(2.5 MG) BY MOUTH DAILY   [DISCONTINUED] Semaglutide, 1 MG/DOSE, (OZEMPIC, 1 MG/DOSE,) 2 MG/1.5ML SOPN Inject 1 mg into the skin once a week.   [DISCONTINUED] simvastatin (ZOCOR) 10 MG tablet TAKE 1 TABLET BY MOUTH AT BEDTIME   [DISCONTINUED] triamterene-hydrochlorothiazide (MAXZIDE-25) 37.5-25 MG tablet Take 1 tablet by mouth daily. Please schedule office visit before any future refills.   No facility-administered medications prior to visit.    Review of Systems     Objective    BP 139/80   Pulse (!) 114   Resp 16   Wt 250 lb 9.6 oz (113.7 kg)   SpO2 98%   BMI 43.02 kg/m    Physical Exam Vitals and nursing note reviewed.  Constitutional:      General: She is not in acute distress.    Appearance: Normal  appearance. She is overweight. She is not ill-appearing, toxic-appearing or diaphoretic.  HENT:     Head: Normocephalic and atraumatic.  Cardiovascular:     Rate and Rhythm: Normal rate and regular rhythm.     Pulses: Normal pulses.     Heart sounds: Normal heart sounds. No murmur heard.   No friction rub. No gallop.  Pulmonary:     Effort: Pulmonary effort is normal. No respiratory distress.     Breath sounds: Normal breath sounds. No stridor. No wheezing, rhonchi or rales.  Chest:     Chest wall: No tenderness.  Abdominal:     General: Bowel sounds are normal.     Palpations: Abdomen is soft.  Musculoskeletal:        General: No swelling, tenderness, deformity or signs of injury. Normal range of motion.     Right lower leg: No edema.     Left  lower leg: No edema.  Skin:    General: Skin is warm and dry.     Capillary Refill: Capillary refill takes less than 2 seconds.     Coloration: Skin is not jaundiced or pale.     Findings: No bruising, erythema, lesion or rash.  Neurological:     General: No focal deficit present.     Mental Status: She is alert and oriented to person, place, and time. Mental status is at baseline.     Cranial Nerves: No cranial nerve deficit.     Sensory: No sensory deficit.     Motor: No weakness.     Coordination: Coordination normal.  Psychiatric:        Mood and Affect: Mood normal.        Behavior: Behavior normal.        Thought Content: Thought content normal.        Judgment: Judgment normal.      Results for orders placed or performed in visit on 10/15/21  POCT glycosylated hemoglobin (Hb A1C)  Result Value Ref Range   Hemoglobin A1C 8.2 (A) 4.0 - 5.6 %   HbA1c POC (<> result, manual entry)     HbA1c, POC (prediabetic range)     HbA1c, POC (controlled diabetic range)      Assessment & Plan     Problem List Items Addressed This Visit       Cardiovascular and Mediastinum   Essential hypertension    Chronic, stable Medications  refilled Recommend diet modifications Continue to push water as drink of choice Recommend follow lower salt restriction      Relevant Medications   simvastatin (ZOCOR) 10 MG tablet   triamterene-hydrochlorothiazide (MAXZIDE-25) 37.5-25 MG tablet   ramipril (ALTACE) 2.5 MG capsule     Endocrine   Diabetes mellitus, type 2 (HCC) - Primary    A1c has gotten progressively worse; unable to fulfull Rx given insurance Samples provided today 3 month f/u encouraged  Reinforced diet balance and modifications      Relevant Medications   simvastatin (ZOCOR) 10 MG tablet   ramipril (ALTACE) 2.5 MG capsule   Semaglutide (RYBELSUS) 3 MG TABS   Other Relevant Orders   POCT glycosylated hemoglobin (Hb A1C) (Completed)   Hyperlipidemia associated with type 2 diabetes mellitus (HCC)    Pulled for medication refill; goal of LDL <70      Relevant Medications   simvastatin (ZOCOR) 10 MG tablet   triamterene-hydrochlorothiazide (MAXZIDE-25) 37.5-25 MG tablet   ramipril (ALTACE) 2.5 MG capsule   Semaglutide (RYBELSUS) 3 MG TABS     Other   Class 3 severe obesity due to excess calories with serious comorbidity and body mass index (BMI) of 40.0 to 44.9 in adult [facility])    Current BMI 43 Discussed importance of healthy weight management Discussed diet and exercise       Relevant Medications   Semaglutide (RYBELSUS) 3 MG TABS   Hypercholesterolemia   Relevant Medications   simvastatin (ZOCOR) 10 MG tablet   triamterene-hydrochlorothiazide (MAXZIDE-25) 37.5-25 MG tablet   ramipril (ALTACE) 2.5 MG capsule     Return in about 3 months (around 01/15/2022) for T2DM management, chonic disease management.      Leilani Merl, FNP, have reviewed all documentation for this visit. The documentation on 10/15/21 for the exam, diagnosis, procedures, and orders are all accurate and complete.   Jacky Kindle, FNP  [facility] [phone] (phone) [phone] (fax)  [facility]

## 2021-10-15 NOTE — Assessment & Plan Note (Signed)
A1c has gotten progressively worse; unable to fulfull Rx given insurance Samples provided today 3 month f/u encouraged  Reinforced diet balance and modifications

## 2021-10-15 NOTE — Assessment & Plan Note (Signed)
Pulled for medication refill; goal of LDL <70

## 2021-10-15 NOTE — Assessment & Plan Note (Signed)
Chronic, stable Medications refilled Recommend diet modifications Continue to push water as drink of choice Recommend follow lower salt restriction

## 2021-10-15 NOTE — Assessment & Plan Note (Signed)
Current BMI 43 Discussed importance of healthy weight management Discussed diet and exercise

## 2021-10-21 ENCOUNTER — Other Ambulatory Visit: Payer: Self-pay | Admitting: Physician Assistant

## 2021-10-21 DIAGNOSIS — E1169 Type 2 diabetes mellitus with other specified complication: Secondary | ICD-10-CM

## 2021-11-09 ENCOUNTER — Other Ambulatory Visit: Payer: Self-pay | Admitting: Family Medicine

## 2021-11-09 DIAGNOSIS — E1169 Type 2 diabetes mellitus with other specified complication: Secondary | ICD-10-CM

## 2021-11-09 LAB — HM DIABETES EYE EXAM

## 2021-11-12 ENCOUNTER — Encounter: Payer: Self-pay | Admitting: Family Medicine

## 2021-11-17 ENCOUNTER — Encounter: Payer: Self-pay | Admitting: Family Medicine

## 2021-12-09 ENCOUNTER — Encounter: Payer: Self-pay | Admitting: Family Medicine

## 2021-12-09 ENCOUNTER — Other Ambulatory Visit: Payer: Self-pay | Admitting: Family Medicine

## 2021-12-09 DIAGNOSIS — E1169 Type 2 diabetes mellitus with other specified complication: Secondary | ICD-10-CM

## 2021-12-09 MED ORDER — RYBELSUS 3 MG PO TABS: 3.0000 mg | ORAL_TABLET | Freq: Every day | ORAL | 3 refills | Status: DC

## 2022-01-07 ENCOUNTER — Other Ambulatory Visit: Payer: Self-pay | Admitting: Family Medicine

## 2022-01-07 DIAGNOSIS — Z1231 Encounter for screening mammogram for malignant neoplasm of breast: Secondary | ICD-10-CM

## 2022-02-02 ENCOUNTER — Encounter: Payer: Self-pay | Admitting: Family Medicine

## 2022-02-02 MED ORDER — RYBELSUS 14 MG PO TABS: 14.0000 mg | ORAL_TABLET | Freq: Every day | ORAL | 0 refills | Status: DC

## 2022-02-15 ENCOUNTER — Other Ambulatory Visit (HOSPITAL_COMMUNITY)
Admission: RE | Admit: 2022-02-15 | Discharge: 2022-02-15 | Disposition: A | Discharge status: Home / Self Care | Payer: Managed Care, Other (non HMO) | Source: Ambulatory Visit | Attending: Family Medicine | Admitting: Family Medicine

## 2022-02-15 ENCOUNTER — Ambulatory Visit (INDEPENDENT_AMBULATORY_CARE_PROVIDER_SITE_OTHER): Payer: Managed Care, Other (non HMO) | Admitting: Family Medicine

## 2022-02-15 ENCOUNTER — Other Ambulatory Visit: Payer: Self-pay

## 2022-02-15 ENCOUNTER — Encounter: Payer: Self-pay | Admitting: Family Medicine

## 2022-02-15 VITALS — BP 118/88 | HR 125 | Temp 98.3°F | Resp 16 | Wt 238.3 lb

## 2022-02-15 DIAGNOSIS — I152 Hypertension secondary to endocrine disorders: Secondary | ICD-10-CM

## 2022-02-15 DIAGNOSIS — E785 Hyperlipidemia, unspecified: Secondary | ICD-10-CM

## 2022-02-15 DIAGNOSIS — E1169 Type 2 diabetes mellitus with other specified complication: Secondary | ICD-10-CM

## 2022-02-15 DIAGNOSIS — Z124 Encounter for screening for malignant neoplasm of cervix: Secondary | ICD-10-CM | POA: Insufficient documentation

## 2022-02-15 DIAGNOSIS — Z1159 Encounter for screening for other viral diseases: Secondary | ICD-10-CM

## 2022-02-15 DIAGNOSIS — E1159 Type 2 diabetes mellitus with other circulatory complications: Secondary | ICD-10-CM | POA: Diagnosis not present

## 2022-02-15 DIAGNOSIS — Z0000 Encounter for general adult medical examination without abnormal findings: Secondary | ICD-10-CM

## 2022-02-15 NOTE — Addendum Note (Signed)
Addended by: Hyacinth Meeker on: 02/15/2022 11:36 AM ? ? Modules accepted: Orders ? ?

## 2022-02-15 NOTE — Assessment & Plan Note (Signed)
Previously well controlled Continue statin Repeat FLP and CMP Goal LDL < 70 

## 2022-02-15 NOTE — Progress Notes (Signed)
I,Sulibeya S Dimas,acting as a Neurosurgeonscribe for Shirlee LatchAngela Treyven Lafauci, MD.,have documented all relevant documentation on the behalf of Shirlee LatchAngela Lowella Kindley, MD,as directed by  Shirlee LatchAngela Minnie Legros, MD while in the presence of Shirlee LatchAngela Omya Winfield, MD.   Complete physical exam   Patient: Carolyn MeiersStacy G Copley   DOB: 1971-02-19   51 y.o. Female  MRN: [id number] Visit Date: 02/15/2022  Today's healthcare provider: Shirlee LatchAngela Terecia Plaut, MD   Chief Complaint  Patient presents with   Annual Exam   Subjective    Carolyn MeiersStacy G Suire is a 51 y.o. female who presents today for a complete physical exam.  She reports consuming a general diet. The patient does not participate in regular exercise at present. She generally feels fairly well. She reports sleeping fairly well. She does not have additional problems to discuss today.  HPI    Past Medical History:  Diagnosis Date   Diabetes mellitus without complication (HCC)    Hypertension    Past Surgical History:  Procedure Laterality Date   BREAST BIOPSY Left 2019   Keloid biopsy,benign    COLONOSCOPY WITH PROPOFOL N/A 03/04/2020   Procedure: COLONOSCOPY WITH PROPOFOL;  Surgeon: Midge MiniumWohl, Darren, MD;  Location: [facility] ENDOSCOPY;  Service: Endoscopy;  Laterality: N/A;  PRIORITY 4   Social History   Socioeconomic History   Marital status: Single    Spouse name: Not on file   Number of children: Not on file   Years of education: Not on file   Highest education level: Not on file  Occupational History    Employer: [employer]  Tobacco Use   Smoking status: Never   Smokeless tobacco: Never  Vaping Use   Vaping Use: Never used  Substance and Sexual Activity   Alcohol use: Yes    Comment: Occasional Wine,none last 24hrs   Drug use: No   Sexual activity: Never  Other Topics Concern   Not on file  Social History Narrative   Not on file   Social Determinants of Health   Financial Resource Strain: Not on file  Food Insecurity: Not on file  Transportation  Needs: Not on file  Physical Activity: Not on file  Stress: Not on file  Social Connections: Not on file  Intimate Partner Violence: Not on file   Family Status  Relation Name Status   Mother  Alive   Father  Deceased   Sister  Alive   Cousin  (Not Specified)   Neg Hx  (Not Specified)   Family History  Problem Relation Age of Onset   Diabetes Mother    Diabetes Father    Hypertension Father    Healthy Sister    Breast cancer Cousin    Ovarian cancer Neg Hx    No Known Allergies  Patient Care Team: Erasmo DownerBacigalupo, Daira Hine M, MD as PCP - General (Family Medicine)   Medications: Outpatient Medications Prior to Visit  Medication Sig   Ascorbic Acid (VITAMIN C) 1000 MG tablet Take 1,000 mg by mouth daily.   aspirin 81 MG tablet BAYER LOW STRENGTH, 81MG  (Oral Tablet Delayed Release)  1 Every Day for 0 days  Quantity: 0.00;  Refills: 0   Ordered :05-Oct-2010  Amie CritchleyMitchell, Stephanie ;  Started 09-May-2007 Active Comments: DX: 250.00   azelastine (ASTELIN) 0.1 % nasal spray instill 1 spray into each nostril twice a day   benzoyl peroxide-erythromycin (BENZAMYCIN) gel APP TO FACE QAM UTD   Calcium Carbonate-Vitamin D 600-200 MG-UNIT CAPS CALCIUM + D, 600-200MG -UNIT (Oral Tablet)  1 Twice Daily  for 0 days  Quantity: 0.00;  Refills: 0   Ordered :05-Oct-2010  Kavin Leech ;  Started 03-July-2010 Active   Cetirizine HCl 10 MG CAPS Take by mouth.   cyclobenzaprine (FLEXERIL) 5 MG tablet TAKE 1 TABLET BY MOUTH AT BEDTIME AS NEEDED FOR MUSCLE SPASMS   ferrous sulfate 325 (65 FE) MG tablet Take by mouth.   IRON-VITAMINS PO Take by mouth.   metFORMIN (GLUCOPHAGE) 1000 MG tablet TAKE 1 TABLET TWICE A DAY WITH A MEAL.   montelukast (SINGULAIR) 10 MG tablet take 1 tablet by mouth at bedtime   MULTIPLE VITAMIN PO MULTIVITAMINS (Oral Tablet)  1 Every Day for 0 days  Quantity: 0.00;  Refills: 0   Ordered :05-Oct-2010  Kavin Leech ;  Started 18-Mar-2010 Active Comments: DX: 250.00   nortriptyline (PAMELOR)  25 MG capsule Take 50 mg by mouth.   potassium gluconate 595 (99 K) MG TABS tablet Take by mouth.   ramipril (ALTACE) 2.5 MG capsule Take 1 capsule (2.5 mg total) by mouth daily.   Semaglutide (RYBELSUS) 14 MG TABS Take 1 tablet (14 mg total) by mouth daily.   simvastatin (ZOCOR) 10 MG tablet Take 1 tablet (10 mg total) by mouth at bedtime.   Tretinoin Microsphere 0.08 % GEL Apply topically at bedtime.   triamterene-hydrochlorothiazide (MAXZIDE-25) 37.5-25 MG tablet Take 1 tablet by mouth daily.   vitamin B-12 (CYANOCOBALAMIN) 1000 MCG tablet Take 1,000 mcg by mouth daily.   Zinc 30 MG TABS Take by mouth.   [DISCONTINUED] amoxicillin (AMOXIL) 500 MG tablet Take 500 mg by mouth 3 (three) times daily. (Patient not taking: Reported on 02/15/2022)   [DISCONTINUED] HYDROcodone-acetaminophen (NORCO) 10-325 MG tablet Take 1 tablet by mouth every 6 (six) hours as needed.   [DISCONTINUED] ibuprofen (ADVIL) 800 MG tablet Take 800 mg by mouth every 8 (eight) hours as needed.   No facility-administered medications prior to visit.    Review of Systems  HENT:  Positive for congestion, facial swelling and sinus pressure.   Allergic/Immunologic: Positive for environmental allergies.  Neurological:  Positive for headaches.  All other systems reviewed and are negative.  Last CBC Lab Results  Component Value Date   WBC 8.9 01/16/2021   HGB 11.9 01/16/2021   HCT 38.2 01/16/2021   MCV 86 01/16/2021   MCH 26.7 01/16/2021   RDW 12.9 01/16/2021   PLT 401 01/16/2021   Last metabolic panel Lab Results  Component Value Date   GLUCOSE 160 (H) 01/16/2021   NA 144 01/16/2021   K 4.4 01/16/2021   CL 104 01/16/2021   CO2 24 01/16/2021   BUN 12 01/16/2021   CREATININE 1.02 (H) 01/16/2021   GFRNONAA 64 01/16/2021   CALCIUM 9.7 01/16/2021   PROT 7.6 01/16/2021   ALBUMIN 4.4 01/16/2021   LABGLOB 3.2 01/16/2021   AGRATIO 1.4 01/16/2021   BILITOT <0.2 01/16/2021   ALKPHOS 98 01/16/2021   AST 15 01/16/2021    ALT 23 01/16/2021   Last lipids Lab Results  Component Value Date   CHOL 163 01/16/2021   HDL 74 01/16/2021   LDLCALC 73 01/16/2021   TRIG 87 01/16/2021   CHOLHDL 2.2 01/14/2020   Last hemoglobin A1c Lab Results  Component Value Date   HGBA1C 8.2 (A) 10/15/2021   Last thyroid functions Lab Results  Component Value Date   TSH 1.380 01/16/2021     Objective    BP 118/88 (BP Location: Left Arm, Patient Position: Sitting, Cuff Size: Large)    Pulse Marland Kitchen)  125    Temp 98.3 F (36.8 C) (Temporal)    Resp 16    Wt 238 lb 4.8 oz (108.1 kg)    BMI 40.90 kg/m  BP Readings from Last 3 Encounters:  02/15/22 118/88  10/15/21 139/80  01/16/21 115/75   Wt Readings from Last 3 Encounters:  02/15/22 238 lb 4.8 oz (108.1 kg)  10/15/21 250 lb 9.6 oz (113.7 kg)  01/16/21 251 lb (113.9 kg)   Physical Exam Vitals reviewed.  Constitutional:      General: She is not in acute distress.    Appearance: Normal appearance. She is well-developed. She is not diaphoretic.  HENT:     Head: Normocephalic and atraumatic.     Right Ear: Tympanic membrane, ear canal and external ear normal.     Left Ear: Tympanic membrane, ear canal and external ear normal.     Nose: Nose normal.     Mouth/Throat:     Mouth: Mucous membranes are moist.     Pharynx: Oropharynx is clear. No oropharyngeal exudate.  Eyes:     General: No scleral icterus.    Conjunctiva/sclera: Conjunctivae normal.     Pupils: Pupils are equal, round, and reactive to light.  Neck:     Thyroid: No thyromegaly.  Cardiovascular:     Rate and Rhythm: Normal rate and regular rhythm.     Pulses: Normal pulses.     Heart sounds: Normal heart sounds. No murmur heard. Pulmonary:     Effort: Pulmonary effort is normal. No respiratory distress.     Breath sounds: Normal breath sounds. No wheezing or rales.  Abdominal:     General: There is no distension.     Palpations: Abdomen is soft.     Tenderness: There is no abdominal tenderness.   Musculoskeletal:        General: No deformity.     Cervical back: Neck supple.     Right lower leg: No edema.     Left lower leg: No edema.  Lymphadenopathy:     Cervical: No cervical adenopathy.  Skin:    General: Skin is warm and dry.     Findings: No rash.  Neurological:     Mental Status: She is alert and oriented to person, place, and time. Mental status is at baseline.     Sensory: No sensory deficit.     Motor: No weakness.     Gait: Gait normal.  Psychiatric:        Mood and Affect: Mood normal.        Behavior: Behavior normal.        Thought Content: Thought content normal.    Diabetic Foot Exam - Simple   Simple Foot Form Diabetic Foot exam was performed with the following findings: Yes 02/15/2022 11:20 AM  Visual Inspection No deformities, no ulcerations, no other skin breakdown bilaterally: Yes Sensation Testing Intact to touch and monofilament testing bilaterally: Yes Pulse Check Posterior Tibialis and Dorsalis pulse intact bilaterally: Yes Comments     PHQ 2/9 Scores 02/15/2022 01/16/2021 10/13/2020  PHQ - 2 Score 1 1 2   PHQ- 9 Score 3 4 4    Last fall risk screening Fall Risk  02/15/2022  Falls in the past year? 0  Number falls in past yr: 0  Injury with Fall? 0  Risk for fall due to : No Fall Risks  Follow up Falls evaluation completed   Last Audit-C alcohol use screening Alcohol Use Disorder Test (AUDIT) 02/15/2022  1. How  often do you have a drink containing alcohol? 1  2. How many drinks containing alcohol do you have on a typical day when you are drinking? 0  3. How often do you have six or more drinks on one occasion? 0  AUDIT-C Score 1  Alcohol Brief Interventions/Follow-up -   A score of 3 or more in women, and 4 or more in men indicates increased risk for alcohol abuse, EXCEPT if all of the points are from question 1   No results found for any visits on 02/15/22.  Assessment & Plan    Routine Health Maintenance and Physical Exam  Exercise  Activities and Dietary recommendations  Goals      Exercise 150 minutes per week (moderate activity)        Immunization History  Administered Date(s) Administered   Influenza-Unspecified 10/14/2015, 10/04/2017, 09/14/2018, 08/29/2019, 09/23/2020, 10/07/2021   PFIZER Comirnaty(Gray Top)Covid-19 Tri-Sucrose Vaccine 04/29/2021   PFIZER(Purple Top)SARS-COV-2 Vaccination 02/14/2020, 03/06/2020, 10/06/2020   Pfizer Covid-19 Vaccine Bivalent Booster 103yrs & up 12/04/2021   Pneumococcal Polysaccharide-23 10/15/2011, 01/14/2020   Td 01/03/2017   Tdap 03/10/2006   Zoster Recombinat (Shingrix) 01/16/2021, 03/27/2021    Health Maintenance  Topic Date Due   Hepatitis C Screening  Never done   PAP SMEAR-Modifier  01/27/2022   HEMOGLOBIN A1C  04/14/2022   OPHTHALMOLOGY EXAM  11/09/2022   MAMMOGRAM  02/13/2023   FOOT EXAM  02/16/2023   TETANUS/TDAP  01/03/2027   COLONOSCOPY (Pts 45-1yrs Insurance coverage will need to be confirmed)  03/04/2030   INFLUENZA VACCINE  Completed   COVID-19 Vaccine  Completed   HIV Screening  Completed   Zoster Vaccines- Shingrix  Completed   HPV VACCINES  Aged Out    Discussed health benefits of physical activity, and encouraged her to engage in regular exercise appropriate for her age and condition.  Problem List Items Addressed This Visit       Cardiovascular and Mediastinum   Hypertension associated with diabetes (HCC)    Well controlled Continue current medications Recheck metabolic panel F/u in 6 months       Relevant Orders   Comprehensive metabolic panel     Endocrine   Diabetes mellitus, type 2 (HCC)    Previously uncontrolled with hyperglycemia Continue metformin and GLP1 Recheck a1c and consider dose titration Foot exam today uACR today       Relevant Orders   Hemoglobin A1c   Urine Microalbumin w/creat. ratio   Hyperlipidemia associated with type 2 diabetes mellitus (HCC)    Previously well controlled Continue  statin Repeat FLP and CMP Goal LDL < 70      Relevant Orders   Lipid Panel With LDL/HDL Ratio     Other   Class 3 severe obesity due to excess calories with serious comorbidity and body mass index (BMI) of 40.0 to 44.9 in adult [facility])    Discussed importance of healthy weight management Discussed diet and exercise       Other Visit Diagnoses     Annual physical exam    -  Primary   Need for hepatitis C screening test       Relevant Orders   Hepatitis C Antibody        Return in about 6 months (around 08/18/2022) for chronic disease f/u.     I, [name], MD, have reviewed all documentation for this visit. The documentation on 02/15/22 for the exam, diagnosis, procedures, and orders are all accurate and complete.   Tanush Drees,  Dionne Bucy, MD, MPH [facility]

## 2022-02-15 NOTE — Assessment & Plan Note (Signed)
Discussed importance of healthy weight management Discussed diet and exercise  

## 2022-02-15 NOTE — Assessment & Plan Note (Signed)
Previously uncontrolled with hyperglycemia ?Continue metformin and GLP1 ?Recheck a1c and consider dose titration ?Foot exam today ?uACR today ? ?

## 2022-02-15 NOTE — Assessment & Plan Note (Signed)
Well controlled Continue current medications Recheck metabolic panel F/u in 6 months  

## 2022-02-16 ENCOUNTER — Ambulatory Visit
Admission: RE | Admit: 2022-02-16 | Discharge: 2022-02-16 | Disposition: A | Discharge status: Home / Self Care | Payer: Managed Care, Other (non HMO) | Source: Ambulatory Visit | Attending: Family Medicine | Admitting: Family Medicine

## 2022-02-16 DIAGNOSIS — Z1231 Encounter for screening mammogram for malignant neoplasm of breast: Secondary | ICD-10-CM | POA: Diagnosis not present

## 2022-02-16 LAB — LIPID PANEL WITH LDL/HDL RATIO
Cholesterol, Total: 185 mg/dL (ref 100–199)
HDL: 75 mg/dL (ref >39)
LDL Chol Calc (NIH): 84 mg/dL (ref 0–99)
LDL/HDL Ratio: 1.1 ratio (ref 0.0–3.2)
Triglycerides: 153 mg/dL — ABNORMAL HIGH (ref 0–149)
VLDL Cholesterol Cal: 26 mg/dL (ref 5–40)

## 2022-02-16 LAB — COMPREHENSIVE METABOLIC PANEL
ALT: 21 IU/L (ref 0–32)
AST: 15 IU/L (ref 0–40)
Albumin/Globulin Ratio: 1.5 (ref 1.2–2.2)
Albumin: 4.6 g/dL (ref 3.8–4.9)
Alkaline Phosphatase: 106 IU/L (ref 44–121)
BUN/Creatinine Ratio: 13 (ref 9–23)
BUN: 12 mg/dL (ref 6–24)
Bilirubin Total: <0.2 mg/dL (ref 0.0–1.2)
CO2: 23 mmol/L (ref 20–29)
Calcium: 10 mg/dL (ref 8.7–10.2)
Chloride: 98 mmol/L (ref 96–106)
Creatinine, Ser: 0.93 mg/dL (ref 0.57–1.00)
Globulin, Total: 3.1 g/dL (ref 1.5–4.5)
Glucose: 364 mg/dL — ABNORMAL HIGH (ref 70–99)
Potassium: 4.3 mmol/L (ref 3.5–5.2)
Sodium: 140 mmol/L (ref 134–144)
Total Protein: 7.7 g/dL (ref 6.0–8.5)
eGFR: 74 mL/min/{1.73_m2} (ref >59)

## 2022-02-16 LAB — HEMOGLOBIN A1C
Est. average glucose Bld gHb Est-mCnc: 280 mg/dL
Hgb A1c MFr Bld: 11.4 % — ABNORMAL HIGH (ref 4.8–5.6)

## 2022-02-16 LAB — MICROALBUMIN / CREATININE URINE RATIO
Creatinine, Urine: 82 mg/dL
Microalb/Creat Ratio: 5 mg/g creat (ref 0–29)
Microalbumin, Urine: 4.4 ug/mL

## 2022-02-16 LAB — HEPATITIS C ANTIBODY: Hep C Virus Ab: NONREACTIVE

## 2022-02-17 ENCOUNTER — Telehealth: Payer: Self-pay

## 2022-02-17 LAB — CYTOLOGY - PAP
Comment: NEGATIVE
Diagnosis: NEGATIVE
High risk HPV: NEGATIVE

## 2022-02-17 MED ORDER — EMPAGLIFLOZIN 10 MG PO TABS: 10.0000 mg | ORAL_TABLET | Freq: Every day | ORAL | 2 refills | Status: DC

## 2022-02-17 NOTE — Telephone Encounter (Signed)
Patient advised as below. Agreed to [place].  ?

## 2022-02-17 NOTE — Telephone Encounter (Signed)
-----   Message from Erasmo Downer, MD sent at 02/16/2022  7:55 AM EST ----- ?Normal/stable labs, except for high A1c of 11.4.  Recommend adding additional medication if she is taking metformin and rybelsus regularly.  Would add Jardiance 10 mg daily. Needs recheck in 3 months.  Ok to send new Rx if patient agrees. ?

## 2022-02-17 NOTE — Telephone Encounter (Signed)
-----   Message from Angela M Bacigalupo, MD sent at 02/16/2022  7:55 AM EST ----- ?Normal/stable labs, except for high A1c of 11.4.  Recommend adding additional medication if she is taking metformin and rybelsus regularly.  Would add Jardiance 10 mg daily. Needs recheck in 3 months.  Ok to send new Rx if patient agrees. ?

## 2022-05-24 ENCOUNTER — Other Ambulatory Visit: Payer: Self-pay | Admitting: Family Medicine

## 2022-05-27 ENCOUNTER — Encounter: Payer: Self-pay | Admitting: Family Medicine

## 2022-05-27 MED ORDER — EMPAGLIFLOZIN 10 MG PO TABS: 10.0000 mg | ORAL_TABLET | Freq: Every day | ORAL | 2 refills | Status: DC

## 2022-05-28 NOTE — Progress Notes (Unsigned)
I,Amyia Lodwick E Zsofia Prout,acting as a scribe for Lavon Paganini, MD.,have documented all relevant documentation on the behalf of Lavon Paganini, MD,as directed by  Lavon Paganini, MD while in the presence of Lavon Paganini, MD.   Established patient visit   Patient: Carolyn Nolan   DOB: 1971/06/24   51 y.o. Female  MRN: [id number] Visit Date: 05/31/2022  Today's healthcare provider: Lavon Paganini, MD   No chief complaint on file.  Subjective    HPI  Diabetes Mellitus Type II, Follow-up  Lab Results  Component Value Date   HGBA1C 7.4 (A) 05/31/2022   HGBA1C 11.4 (H) 02/15/2022   HGBA1C 8.2 (A) 10/15/2021   Wt Readings from Last 3 Encounters:  05/31/22 231 lb 9.6 oz (105.1 kg)  02/15/22 238 lb 4.8 oz (108.1 kg)  10/15/21 250 lb 9.6 oz (113.7 kg)   Last seen for diabetes 3 months ago.  Management since then includes Recommend adding additional medication if she is taking metformin and rybelsus regularly.  Would add Jardiance 10 mg daily. Been off the Rybelsus for a week and Jardiance was back ordered, She is getting it today. She reports excellent compliance with treatment. She is not having side effects.  Symptoms: No fatigue No foot ulcerations  No appetite changes No nausea  No paresthesia of the feet  No polydipsia  No polyuria No visual disturbances   No vomiting     Home blood sugar records:  not being checked  Episodes of hypoglycemia? No    Current insulin regiment: none Most Recent Eye Exam: UTD-11/09/2021 Current exercise: some weights for 15 minutes Current diet habits: well balanced  Pertinent Labs: Lab Results  Component Value Date   CHOL 185 02/15/2022   HDL 75 02/15/2022   LDLCALC 84 02/15/2022   TRIG 153 (H) 02/15/2022   CHOLHDL 2.2 01/14/2020   Lab Results  Component Value Date   NA 140 02/15/2022   K 4.3 02/15/2022   CREATININE 0.93 02/15/2022   EGFR 74 02/15/2022   LABMICR 4.4 02/15/2022      ---------------------------------------------------------------------------------------------------   Medications: Outpatient Medications Prior to Visit  Medication Sig   Ascorbic Acid (VITAMIN C) 1000 MG tablet Take 1,000 mg by mouth daily.   aspirin 81 MG tablet BAYER LOW STRENGTH, 81MG (Oral Tablet Delayed Release)  1 Every Day for 0 days  Quantity: 0.00;  Refills: 0   Ordered :05-Oct-2010  Edmonia James ;  Started 09-May-2007 Active Comments: DX: 250.00   azelastine (ASTELIN) 0.1 % nasal spray instill 1 spray into each nostril twice a day   benzoyl peroxide-erythromycin (BENZAMYCIN) gel APP TO FACE QAM UTD   Calcium Carbonate-Vitamin D 600-200 MG-UNIT CAPS CALCIUM + D, 600-200MG-UNIT (Oral Tablet)  1 Twice Daily for 0 days  Quantity: 0.00;  Refills: 0   Ordered :05-Oct-2010  Ashley Royalty ;  Started 03-July-2010 Active   Cetirizine HCl 10 MG CAPS Take by mouth.   cyclobenzaprine (FLEXERIL) 5 MG tablet TAKE 1 TABLET BY MOUTH AT BEDTIME AS NEEDED FOR MUSCLE SPASMS   ferrous sulfate 325 (65 FE) MG tablet Take by mouth.   gabapentin (NEURONTIN) 100 MG capsule Take 100 mg twice a day for one week, then increase to 200 mg(2 tablets) twice a day and continue   IRON-VITAMINS PO Take by mouth.   metFORMIN (GLUCOPHAGE) 1000 MG tablet TAKE 1 TABLET TWICE A DAY WITH A MEAL.   montelukast (SINGULAIR) 10 MG tablet take 1 tablet by mouth at bedtime   MULTIPLE VITAMIN  PO MULTIVITAMINS (Oral Tablet)  1 Every Day for 0 days  Quantity: 0.00;  Refills: 0   Ordered :05-Oct-2010  Ashley Royalty ;  Started 18-Mar-2010 Active Comments: DX: 250.00   nortriptyline (PAMELOR) 10 MG capsule Take 1 capsule by mouth every morning.   nortriptyline (PAMELOR) 50 MG capsule Take 50 mg by mouth at bedtime.   potassium gluconate 595 (99 K) MG TABS tablet Take by mouth.   ramipril (ALTACE) 2.5 MG capsule Take 1 capsule (2.5 mg total) by mouth daily.   RYBELSUS 14 MG TABS TAKE 1 TABLET DAILY   simvastatin (ZOCOR) 10 MG  tablet Take 1 tablet (10 mg total) by mouth at bedtime.   Tretinoin Microsphere 0.08 % GEL Apply topically at bedtime.   triamterene-hydrochlorothiazide (MAXZIDE-25) 37.5-25 MG tablet Take 1 tablet by mouth daily.   vitamin B-12 (CYANOCOBALAMIN) 1000 MCG tablet Take 1,000 mcg by mouth daily.   Zinc 30 MG TABS Take by mouth.   [DISCONTINUED] empagliflozin (JARDIANCE) 10 MG TABS tablet Take 1 tablet (10 mg total) by mouth daily before breakfast.   [DISCONTINUED] nortriptyline (PAMELOR) 25 MG capsule Take 50 mg by mouth.   No facility-administered medications prior to visit.    Review of Systems per HPI     Objective    BP 117/85 (BP Location: Left Wrist, Patient Position: Sitting, Cuff Size: Normal)   Pulse 100   Temp 97.8 F (36.6 C) (Oral)   Resp 16   Ht 5' 4"  (1.626 m)   Wt 231 lb 9.6 oz (105.1 kg)   BMI 39.75 kg/m    Physical Exam Vitals reviewed.  Constitutional:      General: She is not in acute distress.    Appearance: Normal appearance. She is well-developed. She is not diaphoretic.  HENT:     Head: Normocephalic and atraumatic.  Eyes:     General: No scleral icterus.    Conjunctiva/sclera: Conjunctivae normal.  Neck:     Thyroid: No thyromegaly.  Cardiovascular:     Rate and Rhythm: Normal rate and regular rhythm.     Heart sounds: Normal heart sounds. No murmur heard. Pulmonary:     Effort: Pulmonary effort is normal. No respiratory distress.     Breath sounds: Normal breath sounds. No wheezing, rhonchi or rales.  Musculoskeletal:     Cervical back: Neck supple.     Right lower leg: No edema.     Left lower leg: No edema.  Lymphadenopathy:     Cervical: No cervical adenopathy.  Skin:    General: Skin is warm and dry.  Neurological:     Mental Status: She is alert and oriented to person, place, and time. Mental status is at baseline.  Psychiatric:        Mood and Affect: Mood normal.        Behavior: Behavior normal.       Results for orders placed  or performed in visit on 05/31/22  POCT glycosylated hemoglobin (Hb A1C)  Result Value Ref Range   Hemoglobin A1C 7.4 (A) 4.0 - 5.6 %   Est. average glucose Bld gHb Est-mCnc 166     Assessment & Plan     Problem List Items Addressed This Visit       Endocrine   Diabetes mellitus, type 2 ([place]) - Primary    Improving, but still with hyperglycemia Continue current meds Increase jardiance to 15m daily utd on screenings F/u in 337mnd recheck A1c      Relevant Medications  empagliflozin (JARDIANCE) 25 MG TABS tablet   Other Relevant Orders   POCT glycosylated hemoglobin (Hb A1C) (Completed)     Return in about 3 months (around 08/31/2022) for chronic disease f/u.      I, Lavon Paganini, MD, have reviewed all documentation for this visit. The documentation on 05/31/22 for the exam, diagnosis, procedures, and orders are all accurate and complete.   Bacigalupo, Dionne Bucy, MD, MPH [facility]

## 2022-05-31 ENCOUNTER — Ambulatory Visit: Payer: Managed Care, Other (non HMO) | Admitting: Family Medicine

## 2022-05-31 ENCOUNTER — Encounter: Payer: Self-pay | Admitting: Family Medicine

## 2022-05-31 VITALS — BP 117/85 | HR 100 | Temp 97.8°F | Resp 16 | Ht 64.0 in | Wt 231.6 lb

## 2022-05-31 DIAGNOSIS — E1169 Type 2 diabetes mellitus with other specified complication: Secondary | ICD-10-CM

## 2022-05-31 LAB — POCT GLYCOSYLATED HEMOGLOBIN (HGB A1C)
Est. average glucose Bld gHb Est-mCnc: 166
Hemoglobin A1C: 7.4 % — AB (ref 4.0–5.6)

## 2022-05-31 MED ORDER — EMPAGLIFLOZIN 25 MG PO TABS: 25.0000 mg | ORAL_TABLET | Freq: Every day | ORAL | 1 refills | Status: DC

## 2022-05-31 NOTE — Assessment & Plan Note (Signed)
Improving, but still with hyperglycemia Continue current meds Increase jardiance to 25mg  daily utd on screenings F/u in 5m and recheck A1c

## 2022-07-19 ENCOUNTER — Ambulatory Visit: Payer: Managed Care, Other (non HMO) | Admitting: Family Medicine

## 2022-09-01 ENCOUNTER — Ambulatory Visit: Payer: Managed Care, Other (non HMO) | Admitting: Family Medicine

## 2022-09-01 ENCOUNTER — Encounter: Payer: Self-pay | Admitting: Family Medicine

## 2022-09-01 VITALS — BP 124/89 | HR 93 | Temp 98.2°F | Resp 16 | Wt 231.7 lb

## 2022-09-01 DIAGNOSIS — E1159 Type 2 diabetes mellitus with other circulatory complications: Secondary | ICD-10-CM

## 2022-09-01 DIAGNOSIS — R519 Headache, unspecified: Secondary | ICD-10-CM

## 2022-09-01 DIAGNOSIS — I152 Hypertension secondary to endocrine disorders: Secondary | ICD-10-CM | POA: Diagnosis not present

## 2022-09-01 DIAGNOSIS — E1169 Type 2 diabetes mellitus with other specified complication: Secondary | ICD-10-CM

## 2022-09-01 LAB — POCT GLYCOSYLATED HEMOGLOBIN (HGB A1C)
Est. average glucose Bld gHb Est-mCnc: 134
Hemoglobin A1C: 6.3 % — AB (ref 4.0–5.6)

## 2022-09-01 NOTE — Progress Notes (Signed)
    SUBJECTIVE:   CHIEF COMPLAINT / HPI:   Hypertension: - Medications: ramipril, maxzide - Compliance: good - Checking BP at home: no - Denies any SOB, CP, vision changes, LE edema, medication SEs, or symptoms of hypotension  Diabetes, Type 2 - Last A1c 7.4 05/2022 - Medications: metformin, rybelsus, jardiance (increased at last appt) - Compliance: good - Checking BG at home: no - Exercise: walks about 15-20 min per day - Eye exam: UTD - Foot exam: UTD - Microalbumin: UTD - Statin: yes - Denies symptoms of hypoglycemia, polyuria, polydipsia, numbness extremities, foot ulcers/trauma  Headache - follows with Neuro, last appt 8/17. On gabapentin, effexor, nortriptyline, maxalt prn.  OBJECTIVE:   BP 124/89 (BP Location: Left Arm, Patient Position: Sitting, Cuff Size: Large)   Pulse 93   Temp 98.2 F (36.8 C) (Oral)   Resp 16   Wt 231 lb 11.2 oz (105.1 kg)   BMI 39.77 kg/m   Gen: well appearing, in NAD Card: RRR Lungs: CTAB Ext: WWP, no edema  ASSESSMENT/PLAN:   Hypertension associated with diabetes (HCC) At goal, no changes.   Diabetes mellitus, type 2 (HCC) A1c to goal, 6.3 today with increase in jardiance. Continue current regimen. F/u 6 months for CPE and blood work.   Headache disorder Follows with neuro, undergoing regimen changes with some improvement.      Myles Gip, DO

## 2022-09-01 NOTE — Assessment & Plan Note (Signed)
At goal, no changes

## 2022-09-01 NOTE — Assessment & Plan Note (Signed)
A1c to goal, 6.3 today with increase in jardiance. Continue current regimen. F/u 6 months for CPE and blood work.

## 2022-09-01 NOTE — Assessment & Plan Note (Signed)
Follows with neuro, undergoing regimen changes with some improvement.

## 2022-09-18 ENCOUNTER — Other Ambulatory Visit: Payer: Self-pay | Admitting: Family Medicine

## 2022-09-18 DIAGNOSIS — E7800 Pure hypercholesterolemia, unspecified: Secondary | ICD-10-CM

## 2022-10-05 ENCOUNTER — Other Ambulatory Visit: Payer: Self-pay | Admitting: Family Medicine

## 2022-10-05 ENCOUNTER — Other Ambulatory Visit: Payer: Self-pay

## 2022-10-05 DIAGNOSIS — I152 Hypertension secondary to endocrine disorders: Secondary | ICD-10-CM

## 2022-10-05 DIAGNOSIS — I10 Essential (primary) hypertension: Secondary | ICD-10-CM

## 2022-10-06 LAB — COMPREHENSIVE METABOLIC PANEL
ALT: 22 IU/L (ref 0–32)
AST: 16 IU/L (ref 0–40)
Albumin/Globulin Ratio: 1.5 (ref 1.2–2.2)
Albumin: 4.2 g/dL (ref 3.8–4.9)
Alkaline Phosphatase: 88 IU/L (ref 44–121)
BUN/Creatinine Ratio: 11 (ref 9–23)
BUN: 10 mg/dL (ref 6–24)
Bilirubin Total: <0.2 mg/dL (ref 0.0–1.2)
CO2: 25 mmol/L (ref 20–29)
Calcium: 9.6 mg/dL (ref 8.7–10.2)
Chloride: 100 mmol/L (ref 96–106)
Creatinine, Ser: 0.89 mg/dL (ref 0.57–1.00)
Globulin, Total: 2.8 g/dL (ref 1.5–4.5)
Glucose: 187 mg/dL — ABNORMAL HIGH (ref 70–99)
Potassium: 4.6 mmol/L (ref 3.5–5.2)
Sodium: 141 mmol/L (ref 134–144)
Total Protein: 7 g/dL (ref 6.0–8.5)
eGFR: 78 mL/min/{1.73_m2} (ref >59)

## 2022-11-04 ENCOUNTER — Other Ambulatory Visit: Payer: Self-pay | Admitting: Family Medicine

## 2022-11-04 DIAGNOSIS — E1169 Type 2 diabetes mellitus with other specified complication: Secondary | ICD-10-CM

## 2022-11-05 NOTE — Telephone Encounter (Signed)
Requested medications are due for refill today.  yes  Requested medications are on the active medications list.  yes  Last refill. 11/09/2021 #180 3 rf  Future visit scheduled.   yes  Notes to clinic.  Labs are expired.    Requested Prescriptions  Pending Prescriptions Disp Refills   metFORMIN (GLUCOPHAGE) 1000 MG tablet [Pharmacy Med Name: METFORMIN HCL TABS 1000MG] 180 tablet 3    Sig: TAKE 1 TABLET TWICE A DAY WITH A MEAL.     Endocrinology:  Diabetes - Biguanides Failed - 11/04/2022 12:26 AM      Failed - B12 Level in normal range and within 720 days    No results found for: "VITAMINB12"       Failed - CBC within normal limits and completed in the last 12 months    WBC  Date Value Ref Range Status  01/16/2021 8.9 3.4 - 10.8 x10E3/uL Final   RBC  Date Value Ref Range Status  01/16/2021 4.46 3.77 - 5.28 x10E6/uL Final   Hemoglobin  Date Value Ref Range Status  01/16/2021 11.9 11.1 - 15.9 g/dL Final   Hematocrit  Date Value Ref Range Status  01/16/2021 38.2 34.0 - 46.6 % Final   MCHC  Date Value Ref Range Status  01/16/2021 31.2 (L) 31.5 - 35.7 g/dL Final   [facility]  Date Value Ref Range Status  01/16/2021 26.7 26.6 - 33.0 pg Final   MCV  Date Value Ref Range Status  01/16/2021 86 79 - 97 fL Final   No results found for: "PLTCOUNTKUC", "LABPLAT", "POCPLA" RDW  Date Value Ref Range Status  01/16/2021 12.9 11.7 - 15.4 % Final         Passed - Cr in normal range and within 360 days    Creatinine, Ser  Date Value Ref Range Status  10/05/2022 0.89 0.57 - 1.00 mg/dL Final         Passed - HBA1C is between 0 and 7.9 and within 180 days    Hemoglobin A1C  Date Value Ref Range Status  09/01/2022 6.3 (A) 4.0 - 5.6 % Final   Hgb A1c MFr Bld  Date Value Ref Range Status  02/15/2022 11.4 (H) 4.8 - 5.6 % Final    Comment:             Prediabetes: 5.7 - 6.4          Diabetes: >6.4          Glycemic control for adults with diabetes: <7.0          Passed -  eGFR in normal range and within 360 days    GFR calc Af Amer  Date Value Ref Range Status  01/16/2021 74 >59 mL/min/1.73 Final    Comment:    **In accordance with recommendations from the NKF-ASN Task force,**   [facility] is in the process of updating its eGFR calculation to the   2021 CKD-EPI creatinine equation that estimates kidney function   without a race variable.    GFR calc non Af Amer  Date Value Ref Range Status  01/16/2021 64 >59 mL/min/1.73 Final   eGFR  Date Value Ref Range Status  10/05/2022 78 >59 mL/min/1.73 Final         Passed - Valid encounter within last 6 months    Recent Outpatient Visits           2 months ago Type 2 diabetes mellitus with other specified complication, without long-term current use of insulin ([place])  [place], DO   5 months ago Type 2 diabetes mellitus with other specified complication, without long-term current use of insulin [facility])   [place], Dionne Bucy, MD   8 months ago Annual physical exam   [facility] [place], Dionne Bucy, MD   1 year ago Type 2 diabetes mellitus with other specified complication, without long-term current use of insulin [facility])   [facility] Gwyneth Sprout, FNP   1 year ago Need for shingles vaccine   [facility] Birdie Sons, MD       Future Appointments             In 3 months Bacigalupo, Dionne Bucy, MD [facility], [place]

## 2022-11-11 ENCOUNTER — Encounter: Payer: Self-pay | Admitting: Family Medicine

## 2022-11-12 ENCOUNTER — Other Ambulatory Visit: Payer: Self-pay | Admitting: *Deleted

## 2022-11-12 DIAGNOSIS — E1169 Type 2 diabetes mellitus with other specified complication: Secondary | ICD-10-CM

## 2022-11-12 DIAGNOSIS — E7800 Pure hypercholesterolemia, unspecified: Secondary | ICD-10-CM

## 2022-11-12 DIAGNOSIS — I10 Essential (primary) hypertension: Secondary | ICD-10-CM

## 2022-11-12 MED ORDER — RAMIPRIL 2.5 MG PO CAPS: ORAL_CAPSULE | ORAL | 3 refills | Status: DC

## 2022-11-12 MED ORDER — EMPAGLIFLOZIN 25 MG PO TABS: 25.0000 mg | ORAL_TABLET | Freq: Every day | ORAL | 1 refills | Status: DC

## 2022-11-12 MED ORDER — SIMVASTATIN 10 MG PO TABS: ORAL_TABLET | ORAL | 3 refills | Status: DC

## 2022-11-23 ENCOUNTER — Other Ambulatory Visit: Payer: Self-pay | Admitting: Family Medicine

## 2022-11-23 DIAGNOSIS — I10 Essential (primary) hypertension: Secondary | ICD-10-CM

## 2023-01-26 ENCOUNTER — Other Ambulatory Visit: Payer: Self-pay | Admitting: Family Medicine

## 2023-01-26 DIAGNOSIS — Z1231 Encounter for screening mammogram for malignant neoplasm of breast: Secondary | ICD-10-CM

## 2023-02-21 ENCOUNTER — Ambulatory Visit (INDEPENDENT_AMBULATORY_CARE_PROVIDER_SITE_OTHER): Payer: Managed Care, Other (non HMO) | Admitting: Family Medicine

## 2023-02-21 ENCOUNTER — Encounter: Payer: Self-pay | Admitting: Family Medicine

## 2023-02-21 VITALS — BP 123/85 | HR 114 | Temp 98.3°F | Resp 16 | Ht 65.0 in | Wt 232.9 lb

## 2023-02-21 DIAGNOSIS — E785 Hyperlipidemia, unspecified: Secondary | ICD-10-CM

## 2023-02-21 DIAGNOSIS — E1159 Type 2 diabetes mellitus with other circulatory complications: Secondary | ICD-10-CM

## 2023-02-21 DIAGNOSIS — Z0000 Encounter for general adult medical examination without abnormal findings: Secondary | ICD-10-CM

## 2023-02-21 DIAGNOSIS — E1169 Type 2 diabetes mellitus with other specified complication: Secondary | ICD-10-CM

## 2023-02-21 DIAGNOSIS — I152 Hypertension secondary to endocrine disorders: Secondary | ICD-10-CM

## 2023-02-21 DIAGNOSIS — E669 Obesity, unspecified: Secondary | ICD-10-CM

## 2023-02-21 NOTE — Progress Notes (Signed)
I,Sulibeya S Dimas,acting as a Education administrator for Lavon Paganini, MD.,have documented all relevant documentation on the behalf of Lavon Paganini, MD,as directed by  Lavon Paganini, MD while in the presence of Lavon Paganini, MD.    Complete physical exam   Patient: Carolyn Nolan   DOB: Oct 26, 1971   52 y.o. Female  MRN: [id number] Visit Date: 02/21/2023  Today's healthcare provider: Lavon Paganini, MD   Chief Complaint  Patient presents with   Annual Exam   Subjective    Carolyn Nolan is a 52 y.o. female who presents today for a complete physical exam.  She reports consuming a general diet. Home exercise routine includes stretching. She generally feels well. She reports sleeping well. She does not have additional problems to discuss today.  HPI    Past Medical History:  Diagnosis Date   Diabetes mellitus without complication ([place])    Hypertension    Past Surgical History:  Procedure Laterality Date   BREAST BIOPSY Left 2019   Keloid biopsy,benign    COLONOSCOPY WITH PROPOFOL N/A 03/04/2020   Procedure: COLONOSCOPY WITH PROPOFOL;  Surgeon: Lucilla Lame, MD;  Location: [facility] ENDOSCOPY;  Service: Endoscopy;  Laterality: N/A;  PRIORITY 4   Social History   Socioeconomic History   Marital status: Single    Spouse name: Not on file   Number of children: Not on file   Years of education: Not on file   Highest education level: Not on file  Occupational History    Employer: [employer]  Tobacco Use   Smoking status: Never   Smokeless tobacco: Never  Vaping Use   Vaping Use: Never used  Substance and Sexual Activity   Alcohol use: Yes    Comment: Occasional Wine,none last 24hrs   Drug use: No   Sexual activity: Never  Other Topics Concern   Not on file  Social History Narrative   Not on file   Social Determinants of Health   Financial Resource Strain: Not on file  Food Insecurity: Not on file  Transportation Needs: Not on file  Physical  Activity: Not on file  Stress: Not on file  Social Connections: Not on file  Intimate Partner Violence: Not on file   Family Status  Relation Name Status   Mother  Alive   Father  Deceased   Sister  Alive   Cousin  (Not Specified)   Neg Hx  (Not Specified)   Family History  Problem Relation Age of Onset   Diabetes Mother    Diabetes Father    Hypertension Father    Healthy Sister    Breast cancer Cousin    Ovarian cancer Neg Hx    No Known Allergies  Patient Care Team: Virginia Crews, MD as PCP - General (Family Medicine)   Medications: Outpatient Medications Prior to Visit  Medication Sig   Ascorbic Acid (VITAMIN C) 1000 MG tablet Take 1,000 mg by mouth daily.   aspirin 81 MG tablet BAYER LOW STRENGTH, '81MG'$  (Oral Tablet Delayed Release)  1 Every Day for 0 days  Quantity: 0.00;  Refills: 0   Ordered :05-Oct-2010  Edmonia James ;  Started 09-May-2007 Active Comments: DX: 250.00   azelastine (ASTELIN) 0.1 % nasal spray instill 1 spray into each nostril twice a day   benzoyl peroxide-erythromycin (BENZAMYCIN) gel APP TO FACE QAM UTD   Calcium Carbonate-Vitamin D 600-200 MG-UNIT CAPS CALCIUM + D, 600-'200MG'$ -UNIT (Oral Tablet)  1 Twice Daily for 0 days  Quantity: 0.00;  Refills: 0   Ordered :05-Oct-2010  Ashley Royalty ;  Started 03-July-2010 Active   Cetirizine HCl 10 MG CAPS Take by mouth.   cyclobenzaprine (FLEXERIL) 5 MG tablet TAKE 1 TABLET BY MOUTH AT BEDTIME AS NEEDED FOR MUSCLE SPASMS   empagliflozin (JARDIANCE) 25 MG TABS tablet Take 1 tablet (25 mg total) by mouth daily before breakfast.   ferrous sulfate 325 (65 FE) MG tablet Take by mouth.   gabapentin (NEURONTIN) 100 MG capsule Take 200 mg by mouth. And '300mg'$  at night   IRON-VITAMINS PO Take by mouth.   MAGNESIUM PO Take 400 mg by mouth daily in the afternoon.   metFORMIN (GLUCOPHAGE) 1000 MG tablet TAKE 1 TABLET TWICE A DAY WITH A MEAL.   montelukast (SINGULAIR) 10 MG tablet take 1 tablet by mouth at  bedtime   MULTIPLE VITAMIN PO MULTIVITAMINS (Oral Tablet)  1 Every Day for 0 days  Quantity: 0.00;  Refills: 0   Ordered :05-Oct-2010  Ashley Royalty ;  Started 18-Mar-2010 Active Comments: DX: 250.00   nortriptyline (PAMELOR) 10 MG capsule Take 1 capsule by mouth every morning.   nortriptyline (PAMELOR) 50 MG capsule Take 50 mg by mouth at bedtime.   potassium gluconate 595 (99 K) MG TABS tablet Take by mouth.   ramipril (ALTACE) 2.5 MG capsule TAKE 1 CAPSULE(2.5 MG) BY MOUTH DAILY   RYBELSUS 14 MG TABS TAKE 1 TABLET DAILY   simvastatin (ZOCOR) 10 MG tablet TAKE 1 TABLET(10 MG) BY MOUTH AT BEDTIME   Tretinoin Microsphere 0.08 % GEL Apply topically at bedtime.   triamterene-hydrochlorothiazide (MAXZIDE-25) 37.5-25 MG tablet TAKE 1 TABLET BY MOUTH DAILY   vitamin B-12 (CYANOCOBALAMIN) 1000 MCG tablet Take 1,000 mcg by mouth daily.   Zinc 30 MG TABS Take by mouth.   No facility-administered medications prior to visit.    Review of Systems  Constitutional:  Positive for diaphoresis.  HENT:  Positive for facial swelling and sinus pressure.   Musculoskeletal:  Positive for neck pain.  Allergic/Immunologic: Positive for environmental allergies.  Neurological:  Positive for headaches.    Last CBC Lab Results  Component Value Date   WBC 8.9 01/16/2021   HGB 11.9 01/16/2021   HCT 38.2 01/16/2021   MCV 86 01/16/2021   MCH 26.7 01/16/2021   RDW 12.9 01/16/2021   PLT 401 [id number]   Last metabolic panel Lab Results  Component Value Date   GLUCOSE 187 (H) 10/05/2022   NA 141 10/05/2022   K 4.6 10/05/2022   CL 100 10/05/2022   CO2 25 10/05/2022   BUN 10 10/05/2022   CREATININE 0.89 10/05/2022   EGFR 78 10/05/2022   CALCIUM 9.6 10/05/2022   PROT 7.0 10/05/2022   ALBUMIN 4.2 10/05/2022   LABGLOB 2.8 10/05/2022   AGRATIO 1.5 10/05/2022   BILITOT <0.2 10/05/2022   ALKPHOS 88 10/05/2022   AST 16 10/05/2022   ALT 22 10/05/2022   Last lipids Lab Results  Component Value Date    CHOL 185 02/15/2022   HDL 75 02/15/2022   LDLCALC 84 02/15/2022   TRIG 153 (H) 02/15/2022   CHOLHDL 2.2 01/14/2020   Last hemoglobin A1c Lab Results  Component Value Date   HGBA1C 6.3 (A) 09/01/2022   Last thyroid functions Lab Results  Component Value Date   TSH 1.380 01/16/2021      Objective    BP 123/85 (BP Location: Left Arm, Patient Position: Sitting, Cuff Size: Large)   Pulse (!) 114   Temp 98.3 F (36.8 C) (  Oral)   Resp 16   Ht '5\' 5"'$  (1.651 m)   Wt 232 lb 14.4 oz (105.6 kg)   BMI 38.76 kg/m  BP Readings from Last 3 Encounters:  02/21/23 123/85  09/01/22 124/89  05/31/22 117/85   Wt Readings from Last 3 Encounters:  02/21/23 232 lb 14.4 oz (105.6 kg)  09/01/22 231 lb 11.2 oz (105.1 kg)  05/31/22 231 lb 9.6 oz (105.1 kg)       Physical Exam Vitals reviewed.  Constitutional:      General: She is not in acute distress.    Appearance: Normal appearance. She is well-developed. She is not diaphoretic.  HENT:     Head: Normocephalic and atraumatic.     Right Ear: Tympanic membrane, ear canal and external ear normal.     Left Ear: Tympanic membrane, ear canal and external ear normal.     Nose: Nose normal.     Mouth/Throat:     Mouth: Mucous membranes are moist.     Pharynx: Oropharynx is clear. No oropharyngeal exudate.  Eyes:     General: No scleral icterus.    Conjunctiva/sclera: Conjunctivae normal.     Pupils: Pupils are equal, round, and reactive to light.  Neck:     Thyroid: No thyromegaly.  Cardiovascular:     Rate and Rhythm: Normal rate and regular rhythm.     Pulses: Normal pulses.     Heart sounds: Normal heart sounds. No murmur heard. Pulmonary:     Effort: Pulmonary effort is normal. No respiratory distress.     Breath sounds: Normal breath sounds. No wheezing or rales.  Abdominal:     General: There is no distension.     Palpations: Abdomen is soft.     Tenderness: There is no abdominal tenderness.  Musculoskeletal:        General:  No deformity.     Cervical back: Neck supple.     Right lower leg: No edema.     Left lower leg: No edema.  Lymphadenopathy:     Cervical: No cervical adenopathy.  Skin:    General: Skin is warm and dry.     Findings: No rash.  Neurological:     Mental Status: She is alert and oriented to person, place, and time. Mental status is at baseline.     Gait: Gait normal.  Psychiatric:        Mood and Affect: Mood normal.        Behavior: Behavior normal.        Thought Content: Thought content normal.     Diabetic Foot Exam - Simple   Simple Foot Form Diabetic Foot exam was performed with the following findings: Yes 02/21/2023  9:44 AM  Visual Inspection No deformities, no ulcerations, no other skin breakdown bilaterally: Yes Sensation Testing Intact to touch and monofilament testing bilaterally: Yes Pulse Check Posterior Tibialis and Dorsalis pulse intact bilaterally: Yes Comments      Last depression screening scores    09/01/2022    3:56 PM 02/15/2022   11:03 AM 01/16/2021   10:34 AM  PHQ 2/9 Scores  PHQ - 2 Score 0 1 1  PHQ- 9 Score '2 3 4   '$ Last fall risk screening    09/01/2022    3:56 PM  [place] in the past year? 0  Number falls in past yr: 0  Injury with Fall? 0  Risk for fall due to : No Fall Risks  Follow up  Falls evaluation completed   Last Audit-C alcohol use screening    09/01/2022    3:56 PM  Alcohol Use Disorder Test (AUDIT)  1. How often do you have a drink containing alcohol? 1  2. How many drinks containing alcohol do you have on a typical day when you are drinking? 0  3. How often do you have six or more drinks on one occasion? 0  AUDIT-C Score 1   A score of 3 or more in women, and 4 or more in men indicates increased risk for alcohol abuse, EXCEPT if all of the points are from question 1   No results found for any visits on 02/21/23.  Assessment & Plan    Routine Health Maintenance and Physical Exam  Exercise Activities and Dietary  recommendations  Goals      Exercise 150 minutes per week (moderate activity)        Immunization History  Administered Date(s) Administered   Influenza-Unspecified 10/14/2015, 10/04/2017, 09/14/2018, 08/29/2019, 09/23/2020, 10/07/2021   PFIZER Comirnaty(Gray Top)Covid-19 Tri-Sucrose Vaccine 04/29/2021   PFIZER(Purple Top)SARS-COV-2 Vaccination 02/14/2020, 03/06/2020, 10/06/2020   Pfizer Covid-19 Vaccine Bivalent Booster 45yr & up 12/21/2021   Pneumococcal Polysaccharide-23 10/15/2011, 01/14/2020   Td 01/03/2017   Tdap 03/10/2006   Zoster Recombinat (Shingrix) 01/16/2021, 03/27/2021    Health Maintenance  Topic Date Due   COVID-19 Vaccine (6 - 2023-24 season) 08/13/2022   OPHTHALMOLOGY EXAM  11/09/2022   Diabetic kidney evaluation - Urine ACR  02/16/2023   HEMOGLOBIN A1C  03/02/2023   Diabetic kidney evaluation - eGFR measurement  10/06/2023   MAMMOGRAM  02/17/2024   FOOT EXAM  02/21/2024   DTaP/Tdap/Td (3 - Td or Tdap) 01/03/2027   PAP SMEAR-Modifier  02/16/2027   COLONOSCOPY (Pts 45-466yrInsurance coverage will need to be confirmed)  03/04/2030   INFLUENZA VACCINE  Completed   Hepatitis C Screening  Completed   HIV Screening  Completed   Zoster Vaccines- Shingrix  Completed   HPV VACCINES  Aged Out    Discussed health benefits of physical activity, and encouraged her to engage in regular exercise appropriate for her age and condition.  Problem List Items Addressed This Visit       Cardiovascular and Mediastinum   Hypertension associated with diabetes ([facility]   Well controlled Continue current medications Recheck metabolic panel F/u in 6 months       Relevant Orders   Comprehensive metabolic panel     Endocrine   Diabetes mellitus, type 2 ([place]   Well controlled with last A1c - recheck today Continue current medications UTD on vaccines, eye exam, foot exam On ACEi/ARB On Statin Discussed diet and exercise F/u in 6 months       Relevant Orders    Urine Microalbumin w/creat. ratio   Hemoglobin A1c   Hyperlipidemia associated with type 2 diabetes mellitus (HCC)    Previously well controlled Continue statin Repeat FLP and CMP Goal LDL < 70       Relevant Orders   Comprehensive metabolic panel   Lipid panel     Other   Obesity (BMI 30-39.9)    Discussed importance of healthy weight management Discussed diet and exercise       Other Visit Diagnoses     Encounter for annual physical exam    -  Primary   Relevant Orders   Urine Microalbumin w/creat. ratio   Hemoglobin A1c   Comprehensive metabolic panel   Lipid panel  Return in about 6 months (around 08/24/2023) for chronic disease f/u.     I, Lavon Paganini, MD, have reviewed all documentation for this visit. The documentation on 02/21/23 for the exam, diagnosis, procedures, and orders are all accurate and complete.   Shatona Andujar, Dionne Bucy, MD, MPH [facility]

## 2023-02-21 NOTE — Assessment & Plan Note (Signed)
Well controlled Continue current medications Recheck metabolic panel F/u in 6 months  

## 2023-02-21 NOTE — Assessment & Plan Note (Signed)
Well controlled with last A1c - recheck today Continue current medications UTD on vaccines, eye exam, foot exam On ACEi/ARB On Statin Discussed diet and exercise F/u in 6 months

## 2023-02-21 NOTE — Assessment & Plan Note (Signed)
Discussed importance of healthy weight management Discussed diet and exercise  

## 2023-02-21 NOTE — Assessment & Plan Note (Signed)
Previously well controlled Continue statin Repeat FLP and CMP Goal LDL < 70 

## 2023-02-22 ENCOUNTER — Ambulatory Visit
Admission: RE | Admit: 2023-02-22 | Discharge: 2023-02-22 | Disposition: A | Discharge status: Home / Self Care | Payer: Managed Care, Other (non HMO) | Source: Ambulatory Visit | Attending: Family Medicine | Admitting: Family Medicine

## 2023-02-22 DIAGNOSIS — Z1231 Encounter for screening mammogram for malignant neoplasm of breast: Secondary | ICD-10-CM | POA: Diagnosis present

## 2023-02-22 LAB — COMPREHENSIVE METABOLIC PANEL
ALT: 27 IU/L (ref 0–32)
AST: 19 IU/L (ref 0–40)
Albumin/Globulin Ratio: 1.5 (ref 1.2–2.2)
Albumin: 4.3 g/dL (ref 3.8–4.9)
Alkaline Phosphatase: 81 IU/L (ref 44–121)
BUN/Creatinine Ratio: 9 (ref 9–23)
BUN: 8 mg/dL (ref 6–24)
Bilirubin Total: <0.2 mg/dL (ref 0.0–1.2)
CO2: 24 mmol/L (ref 20–29)
Calcium: 9.7 mg/dL (ref 8.7–10.2)
Chloride: 99 mmol/L (ref 96–106)
Creatinine, Ser: 0.85 mg/dL (ref 0.57–1.00)
Globulin, Total: 2.9 g/dL (ref 1.5–4.5)
Glucose: 151 mg/dL — ABNORMAL HIGH (ref 70–99)
Potassium: 3.7 mmol/L (ref 3.5–5.2)
Sodium: 138 mmol/L (ref 134–144)
Total Protein: 7.2 g/dL (ref 6.0–8.5)
eGFR: 82 mL/min/{1.73_m2} (ref >59)

## 2023-02-22 LAB — LIPID PANEL
Chol/HDL Ratio: 2.2 ratio (ref 0.0–4.4)
Cholesterol, Total: 167 mg/dL (ref 100–199)
HDL: 76 mg/dL (ref >39)
LDL Chol Calc (NIH): 74 mg/dL (ref 0–99)
Triglycerides: 94 mg/dL (ref 0–149)
VLDL Cholesterol Cal: 17 mg/dL (ref 5–40)

## 2023-02-22 LAB — MICROALBUMIN / CREATININE URINE RATIO
Creatinine, Urine: 82.7 mg/dL
Microalb/Creat Ratio: 9 mg/g creat (ref 0–29)
Microalbumin, Urine: 7.3 ug/mL

## 2023-02-22 LAB — HEMOGLOBIN A1C
Est. average glucose Bld gHb Est-mCnc: 157 mg/dL
Hgb A1c MFr Bld: 7.1 % — ABNORMAL HIGH (ref 4.8–5.6)

## 2023-02-23 ENCOUNTER — Ambulatory Visit (INDEPENDENT_AMBULATORY_CARE_PROVIDER_SITE_OTHER): Payer: Managed Care, Other (non HMO) | Admitting: Physician Assistant

## 2023-02-23 DIAGNOSIS — Z23 Encounter for immunization: Secondary | ICD-10-CM | POA: Diagnosis not present

## 2023-03-02 LAB — HM DIABETES EYE EXAM

## 2023-03-23 ENCOUNTER — Encounter: Payer: Self-pay | Admitting: Family Medicine

## 2023-03-24 ENCOUNTER — Ambulatory Visit: Payer: Managed Care, Other (non HMO) | Admitting: Family Medicine

## 2023-05-03 ENCOUNTER — Other Ambulatory Visit: Payer: Self-pay | Admitting: Family Medicine

## 2023-05-03 NOTE — Telephone Encounter (Signed)
Requested medication (s) are due for refill today - yes  Requested medication (s) are on the active medication list -yes  Future visit scheduled -yes  Last refill: 05/25/22 #90 3RF  Notes to clinic: off protocol- provider review   Requested Prescriptions  Pending Prescriptions Disp Refills   RYBELSUS 14 MG TABS [Pharmacy Med Name: RYBELSUS TABS 30'S 14MG ] 90 tablet 3    Sig: TAKE 1 TABLET DAILY     Off-Protocol Failed - 05/03/2023 12:46 AM      Failed - Medication not assigned to a protocol, review manually.      Passed - Valid encounter within last 12 months    Recent Outpatient Visits           2 months ago Encounter for annual physical exam   [place] [facility] [place], Marzella Schlein, MD   8 months ago Type 2 diabetes mellitus with other specified complication, without long-term current use of insulin [facility])   [place] [facility] [place] Dense M, DO   11 months ago Type 2 diabetes mellitus with other specified complication, without long-term current use of insulin [facility])   [place] [facility] [place], Marzella Schlein, MD   1 year ago Annual physical exam   [place] [facility] Erasmo Downer, MD   1 year ago Type 2 diabetes mellitus with other specified complication, without long-term current use of insulin [facility])   [place] [facility] Jacky Kindle, FNP       Future Appointments             In 1 month Bacigalupo, Marzella Schlein, MD [facility], [facility]   In 3 months Bacigalupo, Marzella Schlein, MD [facility]               Requested Prescriptions  Pending Prescriptions Disp Refills   RYBELSUS 14 MG TABS [Pharmacy Med Name: RYBELSUS TABS 30'S 14MG ] 90 tablet 3    Sig: TAKE 1 TABLET DAILY     Off-Protocol Failed - 05/03/2023 12:46 AM      Failed - Medication not assigned to a protocol, review manually.      Passed - Valid  encounter within last 12 months    Recent Outpatient Visits           2 months ago Encounter for annual physical exam   [place] [facility] [place], Marzella Schlein, MD   8 months ago Type 2 diabetes mellitus with other specified complication, without long-term current use of insulin [facility])   [place] [facility] [place] Dense M, DO   11 months ago Type 2 diabetes mellitus with other specified complication, without long-term current use of insulin [facility])   [place] [facility] [place], Marzella Schlein, MD   1 year ago Annual physical exam   [place] [facility] Erasmo Downer, MD   1 year ago Type 2 diabetes mellitus with other specified complication, without long-term current use of insulin [facility])    [facility] Jacky Kindle, FNP       Future Appointments             In 1 month Bacigalupo, Marzella Schlein, MD [facility], [facility]   In 3 months Bacigalupo, Marzella Schlein, MD [facility], [facility]

## 2023-05-11 ENCOUNTER — Other Ambulatory Visit: Payer: Self-pay | Admitting: Family Medicine

## 2023-05-11 DIAGNOSIS — E1169 Type 2 diabetes mellitus with other specified complication: Secondary | ICD-10-CM

## 2023-05-19 ENCOUNTER — Ambulatory Visit: Payer: Managed Care, Other (non HMO) | Admitting: Family Medicine

## 2023-06-07 ENCOUNTER — Ambulatory Visit
Admission: RE | Admit: 2023-06-07 | Discharge: 2023-06-07 | Disposition: A | Discharge status: Home / Self Care | Payer: Managed Care, Other (non HMO) | Source: Ambulatory Visit | Attending: Family Medicine

## 2023-06-07 ENCOUNTER — Ambulatory Visit: Payer: Managed Care, Other (non HMO) | Admitting: Family Medicine

## 2023-06-07 ENCOUNTER — Encounter: Payer: Self-pay | Admitting: Family Medicine

## 2023-06-07 ENCOUNTER — Ambulatory Visit
Admission: RE | Admit: 2023-06-07 | Discharge: 2023-06-07 | Disposition: A | Discharge status: Home / Self Care | Payer: Managed Care, Other (non HMO) | Attending: Family Medicine | Admitting: Family Medicine

## 2023-06-07 VITALS — BP 111/79 | HR 97 | Temp 98.2°F | Resp 12 | Wt 231.8 lb

## 2023-06-07 DIAGNOSIS — I152 Hypertension secondary to endocrine disorders: Secondary | ICD-10-CM

## 2023-06-07 DIAGNOSIS — E669 Obesity, unspecified: Secondary | ICD-10-CM | POA: Diagnosis not present

## 2023-06-07 DIAGNOSIS — E785 Hyperlipidemia, unspecified: Secondary | ICD-10-CM

## 2023-06-07 DIAGNOSIS — E1169 Type 2 diabetes mellitus with other specified complication: Secondary | ICD-10-CM | POA: Diagnosis not present

## 2023-06-07 DIAGNOSIS — E1159 Type 2 diabetes mellitus with other circulatory complications: Secondary | ICD-10-CM

## 2023-06-07 DIAGNOSIS — M25561 Pain in right knee: Secondary | ICD-10-CM

## 2023-06-07 LAB — POCT GLYCOSYLATED HEMOGLOBIN (HGB A1C)
Est. average glucose Bld gHb Est-mCnc: 148
Hemoglobin A1C: 6.8 % — AB (ref 4.0–5.6)

## 2023-06-07 NOTE — Progress Notes (Signed)
I,Carolyn Nolan,acting as a Neurosurgeon for Carolyn Latch, MD.,have documented all relevant documentation on the behalf of Carolyn Latch, MD,as directed by  Carolyn Latch, MD while in the presence of Carolyn Latch, MD.    Established patient visit   Patient: Carolyn Nolan   DOB: August 22, 1971   52 y.o. Female  MRN: [id number] Visit Date: 06/07/2023  Today's healthcare provider: [name], MD   Chief Complaint  Patient presents with   Diabetes   Leg Pain   Subjective    HPI  Diabetes Mellitus Type II, Follow-up  Lab Results  Component Value Date   HGBA1C 6.8 (A) 06/07/2023   HGBA1C 7.1 (H) 02/21/2023   HGBA1C 6.3 (A) 09/01/2022   Wt Readings from Last 3 Encounters:  06/07/23 231 lb 12.8 oz (105.1 kg)  02/21/23 232 lb 14.4 oz (105.6 kg)  09/01/22 231 lb 11.2 oz (105.1 kg)   Last seen for diabetes 3 months ago.  Management since then includes no changes. She reports excellent compliance with treatment. She is not having side effects.   Home blood sugar records:  not being checked  Episodes of hypoglycemia? No    Current insulin regiment: none Most Recent Eye Exam: [facility]   Pertinent Labs: Lab Results  Component Value Date   CHOL 167 02/21/2023   HDL 76 02/21/2023   LDLCALC 74 02/21/2023   TRIG 94 02/21/2023   CHOLHDL 2.2 02/21/2023   Lab Results  Component Value Date   NA 138 02/21/2023   K 3.7 02/21/2023   CREATININE 0.85 02/21/2023   EGFR 82 02/21/2023   LABMICR 7.3 02/21/2023   MICRALBCREAT 9 02/21/2023     --------------------------------------------------------------------------------------------------- Patient reports she fell on to right knee in March. She reports her leg is now feeling "hot" since the end of May. She reports hx of blood clot in the same leg. She denies any changes in swelling.    Discussed the use of AI scribe software for clinical note transcription with the patient, who gave verbal consent to  proceed.  History of Present Illness   Her A1c levels were slightly elevated at 7.1 during the last visit in March, but have since improved to 6.8. She is currently on Jardiance 25mg  daily, Metformin 1000mg  twice daily, and Rybelsus 14mg  daily. The patient reports no issues with these medications.  She also reports a persistent pain in the knee following a fall three months ago. The pain has improved to the point where she can bend the knee fully, but it remains painful, especially when climbing stairs. The patient also notes that the knee feels warm to the touch. She has a history of a blood clot in the same leg, which has remained slightly swollen since the incident.  She also mentions a recent diagnosis of a Baker's cyst in the leg, which occasionally causes discomfort but is not a major concern. The patient has been managing the knee pain with over-the-counter pain medication and a knee brace.      Medications: Outpatient Medications Prior to Visit  Medication Sig   Ascorbic Acid (VITAMIN C) 1000 MG tablet Take 1,000 mg by mouth daily.   aspirin 81 MG tablet BAYER LOW STRENGTH, 81MG  (Oral Tablet Delayed Release)  1 Every Day for 0 days  Quantity: 0.00;  Refills: 0   Ordered :05-Oct-2010  Amie Critchley ;  Started 09-May-2007 Active Comments: DX: 250.00   azelastine (ASTELIN) 0.1 % nasal spray instill 1 spray into each nostril twice a  day   benzoyl peroxide-erythromycin (BENZAMYCIN) gel APP TO FACE QAM UTD   Calcium Carbonate-Vitamin D 600-200 MG-UNIT CAPS CALCIUM + D, 600-200MG -UNIT (Oral Tablet)  1 Twice Daily for 0 days  Quantity: 0.00;  Refills: 0   Ordered :05-Oct-2010  Kavin Leech ;  Started 03-July-2010 Active   Cetirizine HCl 10 MG CAPS Take by mouth.   cyclobenzaprine (FLEXERIL) 5 MG tablet TAKE 1 TABLET BY MOUTH AT BEDTIME AS NEEDED FOR MUSCLE SPASMS   ferrous sulfate 325 (65 FE) MG tablet Take by mouth.   gabapentin (NEURONTIN) 100 MG capsule Take 200 mg by mouth. And 300mg  at  night   IRON-VITAMINS PO Take by mouth.   JARDIANCE 25 MG TABS tablet TAKE 1 TABLET DAILY BEFORE BREAKFAST   MAGNESIUM PO Take 400 mg by mouth daily in the afternoon.   metFORMIN (GLUCOPHAGE) 1000 MG tablet TAKE 1 TABLET TWICE A DAY WITH A MEAL.   montelukast (SINGULAIR) 10 MG tablet take 1 tablet by mouth at bedtime   MULTIPLE VITAMIN PO MULTIVITAMINS (Oral Tablet)  1 Every Day for 0 days  Quantity: 0.00;  Refills: 0   Ordered :05-Oct-2010  Kavin Leech ;  Started 18-Mar-2010 Active Comments: DX: 250.00   nortriptyline (PAMELOR) 10 MG capsule Take 1 capsule by mouth every morning.   nortriptyline (PAMELOR) 50 MG capsule Take 50 mg by mouth at bedtime.   potassium gluconate 595 (99 K) MG TABS tablet Take by mouth.   ramipril (ALTACE) 2.5 MG capsule TAKE 1 CAPSULE(2.5 MG) BY MOUTH DAILY   RYBELSUS 14 MG TABS TAKE 1 TABLET DAILY   simvastatin (ZOCOR) 10 MG tablet TAKE 1 TABLET(10 MG) BY MOUTH AT BEDTIME   Tretinoin Microsphere 0.08 % GEL Apply topically at bedtime.   triamterene-hydrochlorothiazide (MAXZIDE-25) 37.5-25 MG tablet TAKE 1 TABLET BY MOUTH DAILY   vitamin B-12 (CYANOCOBALAMIN) 1000 MCG tablet Take 1,000 mcg by mouth daily.   Zinc 30 MG TABS Take by mouth.   No facility-administered medications prior to visit.    Review of Systems  Constitutional:  Negative for chills and fever.  Respiratory:  Negative for chest tightness and shortness of breath.   Cardiovascular:  Positive for leg swelling. Negative for chest pain.       Objective    BP 111/79 (BP Location: Left Arm, Patient Position: Sitting, Cuff Size: Large)   Pulse 97   Temp 98.2 F (36.8 C) (Temporal)   Resp 12   Wt 231 lb 12.8 oz (105.1 kg)   BMI 38.57 kg/m    Physical Exam Vitals reviewed.  Constitutional:      General: She is not in acute distress.    Appearance: Normal appearance. She is well-developed. She is not diaphoretic.  HENT:     Head: Normocephalic and atraumatic.  Eyes:     General: No  scleral icterus.    Conjunctiva/sclera: Conjunctivae normal.  Neck:     Thyroid: No thyromegaly.  Cardiovascular:     Rate and Rhythm: Normal rate and regular rhythm.     Pulses: Normal pulses.     Heart sounds: Normal heart sounds. No murmur heard. Pulmonary:     Effort: Pulmonary effort is normal. No respiratory distress.     Breath sounds: Normal breath sounds. No wheezing, rhonchi or rales.  Musculoskeletal:     Cervical back: Neck supple.     Right lower leg: No edema.     Left lower leg: No edema.  Lymphadenopathy:     Cervical: No cervical adenopathy.  Skin:    General: Skin is warm and dry.     Findings: No rash.  Neurological:     Mental Status: She is alert and oriented to person, place, and time. Mental status is at baseline.  Psychiatric:        Mood and Affect: Mood normal.        Behavior: Behavior normal.     Physical Exam   EXTREMITIES: Right leg chronically more swollen than left due to past blood clot. Right knee without excess warmth, slightly swollen, pain on movement. MUSCULOSKELETAL: Right knee able to bend fully now, though with pain on steps and discomfort.       Results for orders placed or performed in visit on 06/07/23  POCT glycosylated hemoglobin (Hb A1C)  Result Value Ref Range   Hemoglobin A1C 6.8 (A) 4.0 - 5.6 %   Est. average glucose Bld gHb Est-mCnc 148     Assessment & Plan     Problem List Items Addressed This Visit       Cardiovascular and Mediastinum   Hypertension associated with diabetes (HCC)    Well controlled Continue current medications Recheck metabolic panel at next visit F/u in 6 months         Endocrine   Diabetes mellitus, type 2 (HCC) - Primary    A1c improved from 7.1 to 6.8. Currently on Metformin 1000mg  BID, Jardiance 25mg  daily, and Rybelsus 14mg  daily. Discussed cost concerns with Rybelsus and provided information on savings card. -Continue current medication regimen. -Check A1c in 3 months.       Relevant Orders   POCT glycosylated hemoglobin (Hb A1C) (Completed)   Hyperlipidemia associated with type 2 diabetes mellitus (HCC)    Previously well controlled Continue statin Repeat FLP and CMP annually Goal LDL < 70         Other   Obesity (BMI 30-39.9)    Discussed importance of healthy weight management Discussed diet and exercise       Other Visit Diagnoses     Acute pain of right knee       Relevant Orders   DG Knee Complete 4 Views Right          Type 2 Diabetes Mellitus:   Right Knee Pain: Persistent pain and warmth in right knee following a fall 3 months ago. Possible Baker's cyst. No current acute management. -Order right knee X-ray to assess for any structural damage. -Advise over-the-counter pain management with ibuprofen or Tylenol as needed.  General Health Maintenance: -Ensure completion of annual eye exam for diabetes management.       Return in about 3 months (around 09/07/2023) for chronic disease f/u.      I, Carolyn Latch, MD, have reviewed all documentation for this visit. The documentation on 06/07/23 for the exam, diagnosis, procedures, and orders are all accurate and complete.   Janasha Barkalow, Marzella Schlein, MD, MPH [facility]

## 2023-06-07 NOTE — Assessment & Plan Note (Signed)
Previously well controlled ?Continue statin ?Repeat FLP and CMP annually ?Goal LDL < 70  ?

## 2023-06-07 NOTE — Assessment & Plan Note (Signed)
Well controlled Continue current medications Recheck metabolic panel at next visit F/u in 6 months  

## 2023-06-07 NOTE — Assessment & Plan Note (Signed)
Discussed importance of healthy weight management Discussed diet and exercise  

## 2023-06-07 NOTE — Assessment & Plan Note (Signed)
A1c improved from 7.1 to 6.8. Currently on Metformin 1000mg  BID, Jardiance 25mg  daily, and Rybelsus 14mg  daily. Discussed cost concerns with Rybelsus and provided information on savings card. -Continue current medication regimen. -Check A1c in 3 months.

## 2023-06-10 ENCOUNTER — Encounter: Payer: Self-pay | Admitting: Family Medicine

## 2023-06-21 ENCOUNTER — Other Ambulatory Visit: Payer: Self-pay | Admitting: Family Medicine

## 2023-06-21 DIAGNOSIS — I10 Essential (primary) hypertension: Secondary | ICD-10-CM

## 2023-06-21 NOTE — Telephone Encounter (Signed)
Requested Prescriptions  Pending Prescriptions Disp Refills   triamterene-hydrochlorothiazide (MAXZIDE-25) 37.5-25 MG tablet [Pharmacy Med Name: TRIAMTERENE 37.5MG / HCTZ 25MG  TABS] 90 tablet 1    Sig: TAKE 1 TABLET BY MOUTH DAILY     Cardiovascular: Diuretic Combos Passed - 06/21/2023  8:09 AM      Passed - K in normal range and within 180 days    Potassium  Date Value Ref Range Status  02/21/2023 3.7 3.5 - 5.2 mmol/L Final         Passed - Na in normal range and within 180 days    Sodium  Date Value Ref Range Status  02/21/2023 138 134 - 144 mmol/L Final         Passed - Cr in normal range and within 180 days    Creatinine, Ser  Date Value Ref Range Status  02/21/2023 0.85 0.57 - 1.00 mg/dL Final         Passed - Last BP in normal range    BP Readings from Last 1 Encounters:  06/07/23 111/79         Passed - Valid encounter within last 6 months    Recent Outpatient Visits           2 weeks ago Type 2 diabetes mellitus with other specified complication, without long-term current use of insulin (HCC)   [place] [facility] [place], Marzella Schlein, MD   4 months ago Encounter for annual physical exam   [place] [facility] [place], Marzella Schlein, MD   9 months ago Type 2 diabetes mellitus with other specified complication, without long-term current use of insulin [facility])   [place] [facility] [place], Jill Side M, DO   1 year ago Type 2 diabetes mellitus with other specified complication, without long-term current use of insulin [facility])   [place] [facility] [place], Marzella Schlein, MD   1 year ago Annual physical exam   [place] [facility] [place], Marzella Schlein, MD       Future Appointments             In 2 months Bacigalupo, Marzella Schlein, MD [facility], [facility]

## 2023-08-08 ENCOUNTER — Other Ambulatory Visit: Payer: Self-pay | Admitting: Family Medicine

## 2023-08-08 DIAGNOSIS — I10 Essential (primary) hypertension: Secondary | ICD-10-CM

## 2023-08-09 MED ORDER — TRIAMTERENE-HCTZ 37.5-25 MG PO TABS
1.0000 | ORAL_TABLET | Freq: Every day | ORAL | 0 refills | Status: DC
Start: 2023-08-09 — End: 2023-12-16

## 2023-08-25 ENCOUNTER — Ambulatory Visit: Payer: Managed Care, Other (non HMO) | Admitting: Family Medicine

## 2023-08-29 ENCOUNTER — Ambulatory Visit: Payer: Managed Care, Other (non HMO) | Admitting: Family Medicine

## 2023-08-29 ENCOUNTER — Encounter: Payer: Self-pay | Admitting: Family Medicine

## 2023-08-29 VITALS — BP 132/82 | HR 110 | Ht 65.0 in | Wt 231.9 lb

## 2023-08-29 DIAGNOSIS — E785 Hyperlipidemia, unspecified: Secondary | ICD-10-CM

## 2023-08-29 DIAGNOSIS — I152 Hypertension secondary to endocrine disorders: Secondary | ICD-10-CM

## 2023-08-29 DIAGNOSIS — R519 Headache, unspecified: Secondary | ICD-10-CM

## 2023-08-29 DIAGNOSIS — Z7984 Long term (current) use of oral hypoglycemic drugs: Secondary | ICD-10-CM

## 2023-08-29 DIAGNOSIS — E1159 Type 2 diabetes mellitus with other circulatory complications: Secondary | ICD-10-CM | POA: Diagnosis not present

## 2023-08-29 DIAGNOSIS — E669 Obesity, unspecified: Secondary | ICD-10-CM

## 2023-08-29 DIAGNOSIS — E1169 Type 2 diabetes mellitus with other specified complication: Secondary | ICD-10-CM

## 2023-08-29 NOTE — Progress Notes (Signed)
Established Patient Office Visit  Subjective   Patient ID: Carolyn Nolan, female    DOB: [birth year]  Age: 52 y.o. MRN: [id number]  Chief Complaint  Patient presents with   Medical Management of Chronic Issues    6 month follow up on Diabetes, no concerns today     HPI  Discussed the use of AI scribe software for clinical note transcription with the patient, who gave verbal consent to proceed.  History of Present Illness   The patient, with a history of type 2 diabetes, hyperlipidemia, hypertension, presents for a routine check-up. They are currently on simvastatin 10 mg daily for hyperlipidemia, metformin 1000 mg twice a day, Jardiance 25 mg daily, and Herbelsus 14 mg daily for diabetes, and triamterene HCTZ 37.5-25 mg daily and ramipril 2.5 mg daily for hypertension. They have also started taking a glucosamine supplement for arthritis.  The patient has recently seen a neurologist. However, due to financial constraints, the patient has decided to postpone the MRI until the new year.  The patient's blood pressure was slightly elevated at the beginning of the visit but normalized by the end. They have been adhering to their medication regimen, and their last A1c was 6.8. They have been taking their medications as prescribed and have not reported any adverse effects.         ROS per HPI    Objective:     BP 132/82 (BP Location: Left Arm, Patient Position: Sitting, Cuff Size: Large)   Pulse (!) 110   Ht 5\' 5"  (1.651 m)   Wt 231 lb 14.4 oz (105.2 kg)   SpO2 100%   BMI 38.59 kg/m    Physical Exam Vitals reviewed.  Constitutional:      General: She is not in acute distress.    Appearance: Normal appearance. She is well-developed. She is not diaphoretic.  HENT:     Head: Normocephalic and atraumatic.  Eyes:     General: No scleral icterus.    Conjunctiva/sclera: Conjunctivae normal.  Neck:     Thyroid: No thyromegaly.  Cardiovascular:     Rate and Rhythm: Normal rate  and regular rhythm.     Pulses: Normal pulses.     Heart sounds: Normal heart sounds. No murmur heard. Pulmonary:     Effort: Pulmonary effort is normal. No respiratory distress.     Breath sounds: Normal breath sounds. No wheezing, rhonchi or rales.  Musculoskeletal:     Cervical back: Neck supple.     Right lower leg: No edema.     Left lower leg: No edema.  Lymphadenopathy:     Cervical: No cervical adenopathy.  Skin:    General: Skin is warm and dry.     Findings: No rash.  Neurological:     Mental Status: She is alert and oriented to person, place, and time. Mental status is at baseline.  Psychiatric:        Mood and Affect: Mood normal.        Behavior: Behavior normal.      No results found for any visits on 08/29/23.    The 10-year ASCVD risk score (Arnett DK, et al., 2019) is: 6.6%    Assessment & Plan:   Problem List Items Addressed This Visit       Cardiovascular and Mediastinum   Hypertension associated with diabetes (HCC) - Primary    Slightly elevated blood pressure at the beginning of the visit, but normalized by the end. On Triamterene-HCTZ 37.5-25mg  daily  and Ramipril 2.5mg  daily. -Continue current regimen. -Recheck blood pressure in 6 months.      Relevant Orders   Comprehensive metabolic panel     Endocrine   Diabetes mellitus, type 2 (HCC)    Well controlled on Metformin 1000mg  BID, Jardiance 25mg  daily, and Rybelsus 14mg  daily. Last A1c was 6.8. -Continue current regimen. -Order labs including A1c, kidney and liver function.      Relevant Orders   Hemoglobin A1c   Hyperlipidemia associated with type 2 diabetes mellitus (HCC)    On Simvastatin 10mg  daily. -Continue current regimen. -Order lipid panel.      Relevant Orders   Comprehensive metabolic panel   Lipid panel     Other   Obesity (BMI 30-39.9)    Discussed importance of healthy weight management Discussed diet and exercise       Headache disorder    Recently seen by  neurology and recommended brain MRI. Patient wishes to delay MRI until the new year due to cost. -Respect patient's decision to delay MRI. -Review neurology notes.           Arthritis Recently started glucosamine supplement. -Continue glucosamine, no contraindications noted.  General Health Maintenance -Plan to receive flu shot at work. -Discussed potential need for annual COVID-19 vaccinations. -Schedule physical for 6 months from now.        Return in about 6 months (around 02/26/2024) for CPE.    [name], MD

## 2023-08-29 NOTE — Assessment & Plan Note (Signed)
Slightly elevated blood pressure at the beginning of the visit, but normalized by the end. On Triamterene-HCTZ 37.5-25mg  daily and Ramipril 2.5mg  daily. -Continue current regimen. -Recheck blood pressure in 6 months.

## 2023-08-29 NOTE — Assessment & Plan Note (Signed)
Well controlled on Metformin 1000mg  BID, Jardiance 25mg  daily, and Rybelsus 14mg  daily. Last A1c was 6.8. -Continue current regimen. -Order labs including A1c, kidney and liver function.

## 2023-08-29 NOTE — Assessment & Plan Note (Signed)
On Simvastatin 10mg  daily. -Continue current regimen. -Order lipid panel.

## 2023-08-29 NOTE — Assessment & Plan Note (Signed)
Discussed importance of healthy weight management Discussed diet and exercise

## 2023-08-29 NOTE — Assessment & Plan Note (Signed)
Recently seen by neurology and recommended brain MRI. Patient wishes to delay MRI until the new year due to cost. -Respect patient's decision to delay MRI. -Review neurology notes.

## 2023-08-30 ENCOUNTER — Encounter: Payer: Self-pay | Admitting: Family Medicine

## 2023-08-30 LAB — COMPREHENSIVE METABOLIC PANEL
ALT: 31 IU/L (ref 0–32)
AST: 24 IU/L (ref 0–40)
Albumin: 4.4 g/dL (ref 3.8–4.9)
Alkaline Phosphatase: 94 IU/L (ref 44–121)
BUN/Creatinine Ratio: 12 (ref 9–23)
BUN: 9 mg/dL (ref 6–24)
Bilirubin Total: <0.2 mg/dL (ref 0.0–1.2)
CO2: 25 mmol/L (ref 20–29)
Calcium: 10 mg/dL (ref 8.7–10.2)
Chloride: 104 mmol/L (ref 96–106)
Creatinine, Ser: 0.77 mg/dL (ref 0.57–1.00)
Globulin, Total: 3 g/dL (ref 1.5–4.5)
Glucose: 136 mg/dL — ABNORMAL HIGH (ref 70–99)
Potassium: 4.4 mmol/L (ref 3.5–5.2)
Sodium: 144 mmol/L (ref 134–144)
Total Protein: 7.4 g/dL (ref 6.0–8.5)
eGFR: 92 mL/min/{1.73_m2} (ref >59)

## 2023-08-30 LAB — LIPID PANEL
Chol/HDL Ratio: 2.2 ratio (ref 0.0–4.4)
Cholesterol, Total: 186 mg/dL (ref 100–199)
HDL: 83 mg/dL (ref >39)
LDL Chol Calc (NIH): 89 mg/dL (ref 0–99)
Triglycerides: 74 mg/dL (ref 0–149)
VLDL Cholesterol Cal: 14 mg/dL (ref 5–40)

## 2023-08-30 LAB — HEMOGLOBIN A1C
Est. average glucose Bld gHb Est-mCnc: 171 mg/dL
Hgb A1c MFr Bld: 7.6 % — ABNORMAL HIGH (ref 4.8–5.6)

## 2023-09-01 NOTE — Telephone Encounter (Signed)
When patient messages back in a couple of weeks, ok to d/c Rybelsus and order mounjaro 2.5 mg weekly x4 weeks r0 and then 5mg  weekly 1 month supply with 2 refills.

## 2023-09-13 MED ORDER — TIRZEPATIDE 5 MG/0.5ML ~~LOC~~ SOAJ: 5.0000 mg | SUBCUTANEOUS | 2 refills | Status: DC

## 2023-09-13 MED ORDER — TIRZEPATIDE 2.5 MG/0.5ML ~~LOC~~ SOAJ: 2.5000 mg | SUBCUTANEOUS | 0 refills | Status: DC

## 2023-09-13 NOTE — Addendum Note (Signed)
Addended by: Marjie Skiff on: 09/13/2023 08:07 AM   Modules accepted: Orders

## 2023-09-28 MED ORDER — TIRZEPATIDE 2.5 MG/0.5ML ~~LOC~~ SOAJ: 2.5000 mg | SUBCUTANEOUS | 0 refills | Status: DC

## 2023-09-28 NOTE — Addendum Note (Signed)
Addended by: Marjie Skiff on: 09/28/2023 10:41 AM   Modules accepted: Orders

## 2023-10-27 ENCOUNTER — Other Ambulatory Visit: Payer: Self-pay | Admitting: Family Medicine

## 2023-10-27 DIAGNOSIS — I10 Essential (primary) hypertension: Secondary | ICD-10-CM

## 2023-10-27 NOTE — Telephone Encounter (Signed)
Requested Prescriptions  Pending Prescriptions Disp Refills   ramipril (ALTACE) 2.5 MG capsule [Pharmacy Med Name: RAMIPRIL 2.5MG  CAPSULES] 90 capsule 3    Sig: TAKE 1 CAPSULE(2.5 MG) BY MOUTH DAILY     Cardiovascular:  ACE Inhibitors Passed - 10/27/2023  8:09 AM      Passed - Cr in normal range and within 180 days    Creatinine, Ser  Date Value Ref Range Status  08/29/2023 0.77 0.57 - 1.00 mg/dL Final         Passed - K in normal range and within 180 days    Potassium  Date Value Ref Range Status  08/29/2023 4.4 3.5 - 5.2 mmol/L Final         Passed - Patient is not pregnant      Passed - Last BP in normal range    BP Readings from Last 1 Encounters:  08/29/23 132/82         Passed - Valid encounter within last 6 months    Recent Outpatient Visits           1 month ago Hypertension associated with diabetes [facility])   [place] [facility] [place], Marzella Schlein, MD   4 months ago Type 2 diabetes mellitus with other specified complication, without long-term current use of insulin [facility])   [place] [facility] [place], Marzella Schlein, MD   8 months ago Encounter for annual physical exam   [place] [facility] [place], Marzella Schlein, MD   1 year ago Type 2 diabetes mellitus with other specified complication, without long-term current use of insulin [facility])   [place] [facility] [place], Darl Householder, DO   1 year ago Type 2 diabetes mellitus with other specified complication, without long-term current use of insulin [facility])    [facility] [place]al[place], Marzella Schlein, MD       Future Appointments             In 4 months Bacigalupo, Marzella Schlein, MD [facility], [facility]

## 2023-11-01 ENCOUNTER — Other Ambulatory Visit: Payer: Self-pay | Admitting: Family Medicine

## 2023-11-01 DIAGNOSIS — E7800 Pure hypercholesterolemia, unspecified: Secondary | ICD-10-CM

## 2023-11-24 ENCOUNTER — Other Ambulatory Visit: Payer: Self-pay | Admitting: Family Medicine

## 2023-11-24 ENCOUNTER — Encounter: Payer: Self-pay | Admitting: Family Medicine

## 2023-11-24 DIAGNOSIS — E1169 Type 2 diabetes mellitus with other specified complication: Secondary | ICD-10-CM

## 2023-11-24 NOTE — Telephone Encounter (Signed)
Requested medications are due for refill today.  yes  Requested medications are on the active medications list.  yes  Last refill. 11/08/2022 #180 3 rf  Future visit scheduled.   yes  Notes to clinic.  Expired labs.    Requested Prescriptions  Pending Prescriptions Disp Refills   metFORMIN (GLUCOPHAGE) 1000 MG tablet [Pharmacy Med Name: METFORMIN HCL TABS 1000MG ] 180 tablet 3    Sig: TAKE 1 TABLET TWICE A DAY WITH A MEAL.     Endocrinology:  Diabetes - Biguanides Failed - 11/24/2023 10:24 AM      Failed - B12 Level in normal range and within 720 days    No results found for: "VITAMINB12"       Failed - CBC within normal limits and completed in the last 12 months    WBC  Date Value Ref Range Status  01/16/2021 8.9 3.4 - 10.8 x10E3/uL Final   RBC  Date Value Ref Range Status  01/16/2021 4.46 3.77 - 5.28 x10E6/uL Final   Hemoglobin  Date Value Ref Range Status  01/16/2021 11.9 11.1 - 15.9 g/dL Final   Hematocrit  Date Value Ref Range Status  01/16/2021 38.2 34.0 - 46.6 % Final   MCHC  Date Value Ref Range Status  01/16/2021 31.2 (L) 31.5 - 35.7 g/dL Final   [facility]  Date Value Ref Range Status  01/16/2021 26.7 26.6 - 33.0 pg Final   MCV  Date Value Ref Range Status  01/16/2021 86 79 - 97 fL Final   No results found for: "PLTCOUNTKUC", "LABPLAT", "POCPLA" RDW  Date Value Ref Range Status  01/16/2021 12.9 11.7 - 15.4 % Final         Passed - Cr in normal range and within 360 days    Creatinine, Ser  Date Value Ref Range Status  08/29/2023 0.77 0.57 - 1.00 mg/dL Final         Passed - HBA1C is between 0 and 7.9 and within 180 days    Hgb A1c MFr Bld  Date Value Ref Range Status  08/29/2023 7.6 (H) 4.8 - 5.6 % Final    Comment:             Prediabetes: 5.7 - 6.4          Diabetes: >6.4          Glycemic control for adults with diabetes: <7.0          Passed - eGFR in normal range and within 360 days    GFR calc Af Amer  Date Value Ref Range Status   01/16/2021 74 >59 mL/min/1.73 Final    Comment:    **In accordance with recommendations from the NKF-ASN Task force,**   [facility] is in the process of updating its eGFR calculation to the   2021 CKD-EPI creatinine equation that estimates kidney function   without a race variable.    GFR calc non Af Amer  Date Value Ref Range Status  01/16/2021 64 >59 mL/min/1.73 Final   eGFR  Date Value Ref Range Status  08/29/2023 92 >59 mL/min/1.73 Final         Passed - Valid encounter within last 6 months    Recent Outpatient Visits           2 months ago Hypertension associated with diabetes [facility])   [place] [facility] [place], Marzella Schlein, MD   5 months ago Type 2 diabetes mellitus with other specified complication, without long-term current use of insulin (HCC)  [facility] [place], Marzella Schlein, MD   9 months ago Encounter for annual physical exam   [place] [facility] [place], Marzella Schlein, MD   1 year ago Type 2 diabetes mellitus with other specified complication, without long-term current use of insulin [facility])   [place] [facility] Caro [place], DO   1 year ago Type 2 diabetes mellitus with other specified complication, without long-term current use of insulin [facility])   [place] [facility] Bacigalupo, Marzella Schlein, MD       Future Appointments             In 3 months Bacigalupo, Marzella Schlein, MD [facility], [facility]

## 2023-12-16 ENCOUNTER — Telehealth: Payer: Self-pay | Admitting: Family Medicine

## 2023-12-16 ENCOUNTER — Other Ambulatory Visit: Payer: Self-pay

## 2023-12-16 DIAGNOSIS — I10 Essential (primary) hypertension: Secondary | ICD-10-CM

## 2023-12-16 MED ORDER — TRIAMTERENE-HCTZ 37.5-25 MG PO TABS: 1.0000 | ORAL_TABLET | Freq: Every day | ORAL | 0 refills | Status: DC

## 2023-12-16 NOTE — Telephone Encounter (Signed)
 [facility] faxed refill request for the following medications:   triamterene-hydrochlorothiazide (MAXZIDE-25) 37.5-25 MG tablet     Please advise.

## 2023-12-19 ENCOUNTER — Other Ambulatory Visit: Payer: Self-pay | Admitting: Family Medicine

## 2023-12-19 DIAGNOSIS — I10 Essential (primary) hypertension: Secondary | ICD-10-CM

## 2023-12-21 NOTE — Telephone Encounter (Signed)
 [facility] faxed refill request for the following medications:   triamterene-hydrochlorothiazide (MAXZIDE-25) 37.5-25 MG tablet     Please advise

## 2023-12-26 ENCOUNTER — Encounter: Payer: Self-pay | Admitting: Family Medicine

## 2023-12-27 ENCOUNTER — Other Ambulatory Visit: Payer: Self-pay

## 2023-12-27 DIAGNOSIS — E1169 Type 2 diabetes mellitus with other specified complication: Secondary | ICD-10-CM

## 2023-12-27 DIAGNOSIS — I10 Essential (primary) hypertension: Secondary | ICD-10-CM

## 2023-12-27 MED ORDER — METFORMIN HCL 1000 MG PO TABS: 1000.0000 mg | ORAL_TABLET | Freq: Two times a day (BID) | ORAL | 3 refills | Status: AC

## 2023-12-27 MED ORDER — TRIAMTERENE-HCTZ 37.5-25 MG PO TABS: 1.0000 | ORAL_TABLET | Freq: Every day | ORAL | 0 refills | Status: DC

## 2024-02-23 ENCOUNTER — Ambulatory Visit: Payer: Managed Care, Other (non HMO) | Admitting: Family Medicine

## 2024-02-23 ENCOUNTER — Encounter: Payer: Self-pay | Admitting: Family Medicine

## 2024-02-23 VITALS — BP 131/88 | HR 113 | Ht 64.0 in | Wt 232.0 lb

## 2024-02-23 DIAGNOSIS — Z0000 Encounter for general adult medical examination without abnormal findings: Secondary | ICD-10-CM

## 2024-02-23 DIAGNOSIS — E669 Obesity, unspecified: Secondary | ICD-10-CM | POA: Diagnosis not present

## 2024-02-23 DIAGNOSIS — E785 Hyperlipidemia, unspecified: Secondary | ICD-10-CM

## 2024-02-23 DIAGNOSIS — Z7985 Long-term (current) use of injectable non-insulin antidiabetic drugs: Secondary | ICD-10-CM

## 2024-02-23 DIAGNOSIS — Z7984 Long term (current) use of oral hypoglycemic drugs: Secondary | ICD-10-CM

## 2024-02-23 DIAGNOSIS — E1159 Type 2 diabetes mellitus with other circulatory complications: Secondary | ICD-10-CM

## 2024-02-23 DIAGNOSIS — E1169 Type 2 diabetes mellitus with other specified complication: Secondary | ICD-10-CM | POA: Diagnosis not present

## 2024-02-23 DIAGNOSIS — Z23 Encounter for immunization: Secondary | ICD-10-CM | POA: Diagnosis not present

## 2024-02-23 DIAGNOSIS — Z0001 Encounter for general adult medical examination with abnormal findings: Secondary | ICD-10-CM | POA: Diagnosis not present

## 2024-02-23 DIAGNOSIS — Z1231 Encounter for screening mammogram for malignant neoplasm of breast: Secondary | ICD-10-CM

## 2024-02-23 DIAGNOSIS — I152 Hypertension secondary to endocrine disorders: Secondary | ICD-10-CM

## 2024-02-23 MED ORDER — TIRZEPATIDE 7.5 MG/0.5ML ~~LOC~~ SOAJ: 7.5000 mg | SUBCUTANEOUS | 1 refills | Status: DC

## 2024-02-23 NOTE — Assessment & Plan Note (Signed)
 She is on Mounjaro 5 mg weekly with inadequate weight loss and appetite suppression. Frequent urination and incontinence suggest suboptimal glycemic control. - Increase Mounjaro to 7.5 mg weekly to improve glycemic control and aid in weight loss - continue metformin at current dose - UACR and foot exam today - upcoming eye exam - Monitor A1c levels - Plan follow-up in 6 months if A1c is stable; consider 17-month follow-up if A1c is elevated

## 2024-02-23 NOTE — Assessment & Plan Note (Signed)
 Blood pressure is well-controlled on ramipril and triamterene HCTZ.

## 2024-02-23 NOTE — Assessment & Plan Note (Signed)
 She is on simvastatin 10 mg daily. Continue. Recheck FLP

## 2024-02-23 NOTE — Assessment & Plan Note (Signed)
 Increase mounjaro as above Discussed importance of healthy weight management Discussed diet and exercise

## 2024-02-23 NOTE — Progress Notes (Signed)
 Complete physical exam   Patient: Carolyn Nolan   DOB: September 13, 1971   53 y.o. Female  MRN: [id number] Visit Date: 02/23/2024  Today's healthcare provider: [name], MD   Chief Complaint  Patient presents with   Annual Exam    Last completed 02/21/23 Diet -  General well balanced Exercise - not as much as she would like just minor stretches Feeling - well Sleeping - fairly well Concerns - none    Care Management    Pneumococcal Vaccine - yes    Subjective    Carolyn Nolan is a 53 y.o. female who presents today for a complete physical exam.    Discussed the use of AI scribe software for clinical note transcription with the patient, who gave verbal consent to proceed.  History of Present Illness   A 53 year old patient with a history of hypertension, type two diabetes, hyperlipidemia, and obesity presents for an annual physical. The patient is currently on metformin 1000 mg BID, ramipril 2.5 mg daily, simvastatin ten mgs daily, triamterene HCTZ twenty five mgs daily, and Mounjaro five mgs weekly. The patient reports that she is not experiencing the expected weight loss and appetite suppression from [facility]. She also notes an increase in urinary frequency, to the point of needing incontinence pads. The patient believes this may be a sign that her blood sugar is not well controlled.  In addition to her concerns about diabetes management, the patient also reports two falls in the past year, resulting in injuries to the same knee and elbow. The patient is also due for a mammogram and has an upcoming eye exam.        Last depression screening scores    08/29/2023    1:30 PM 06/07/2023    1:14 PM 09/01/2022    3:56 PM  PHQ 2/9 Scores  PHQ - 2 Score 2 1 0  PHQ- 9 Score 3 2 2    Last fall risk screening    06/07/2023    1:08 PM  Fall Risk   Falls in the past year? 1  Number falls in past yr: 0  Injury with Fall? 1  Risk for fall due to : History of fall(s)  Follow  up Falls evaluation completed        Medications: Outpatient Medications Prior to Visit  Medication Sig   Ascorbic Acid (VITAMIN C) 1000 MG tablet Take 1,000 mg by mouth daily.   aspirin 81 MG tablet BAYER LOW STRENGTH, 81MG  (Oral Tablet Delayed Release)  1 Every Day for 0 days  Quantity: 0.00;  Refills: 0   Ordered :05-Oct-2010  Amie Critchley ;  Started 09-May-2007 Active Comments: DX: 250.00   azelastine (ASTELIN) 0.1 % nasal spray instill 1 spray into each nostril twice a day   benzoyl peroxide-erythromycin (BENZAMYCIN) gel APP TO FACE QAM UTD   Calcium Carbonate-Vitamin D 600-200 MG-UNIT CAPS CALCIUM + D, 600-200MG -UNIT (Oral Tablet)  1 Twice Daily for 0 days  Quantity: 0.00;  Refills: 0   Ordered :05-Oct-2010  Kavin Leech ;  Started 03-July-2010 Active   Cetirizine HCl 10 MG CAPS Take by mouth.   Collagen-Boron-Hyaluronic Acid (MOVE FREE ULTRA JOINT HEALTH) 40-5-3.3 MG TABS Take 1 capsule by mouth daily.   cyclobenzaprine (FLEXERIL) 5 MG tablet TAKE 1 TABLET BY MOUTH AT BEDTIME AS NEEDED FOR MUSCLE SPASMS   ferrous sulfate 325 (65 FE) MG tablet Take by mouth.   gabapentin (NEURONTIN) 100 MG capsule Take 300 mg by mouth. Take  300 mg during the day and 200 mg at night   glucosamine-chondroitin 500-400 MG tablet Take 1 tablet by mouth 3 (three) times daily.   IRON-VITAMINS PO Take by mouth.   JARDIANCE 25 MG TABS tablet TAKE 1 TABLET DAILY BEFORE BREAKFAST   MAGNESIUM PO Take 400 mg by mouth daily in the afternoon.   metFORMIN (GLUCOPHAGE) 1000 MG tablet Take 1 tablet (1,000 mg total) by mouth 2 (two) times daily with a meal.   montelukast (SINGULAIR) 10 MG tablet take 1 tablet by mouth at bedtime   MULTIPLE VITAMIN PO MULTIVITAMINS (Oral Tablet)  1 Every Day for 0 days  Quantity: 0.00;  Refills: 0   Ordered :05-Oct-2010  Kavin Leech ;  Started 18-Mar-2010 Active Comments: DX: 250.00   nortriptyline (PAMELOR) 10 MG capsule Take 1 capsule by mouth every morning.   nortriptyline  (PAMELOR) 50 MG capsule Take 50 mg by mouth at bedtime.   potassium gluconate 595 (99 K) MG TABS tablet Take by mouth.   ramipril (ALTACE) 2.5 MG capsule TAKE 1 CAPSULE(2.5 MG) BY MOUTH DAILY   simvastatin (ZOCOR) 10 MG tablet TAKE 1 TABLET(10 MG) BY MOUTH AT BEDTIME   Tretinoin Microsphere 0.08 % GEL Apply topically at bedtime.   triamterene-hydrochlorothiazide (MAXZIDE-25) 37.5-25 MG tablet Take 1 tablet by mouth daily.   vitamin B-12 (CYANOCOBALAMIN) 1000 MCG tablet Take 1,000 mcg by mouth daily.   Zinc 30 MG TABS Take by mouth.   [DISCONTINUED] RYBELSUS 14 MG TABS TAKE 1 TABLET DAILY   [DISCONTINUED] tirzepatide (MOUNJARO) 5 MG/0.5ML Pen Inject 5 mg into the skin once a week.   [DISCONTINUED] tirzepatide [facility]) 2.5 MG/0.5ML Pen Inject 2.5 mg into the skin once a week. (Patient not taking: Reported on 02/23/2024)   No facility-administered medications prior to visit.    Review of Systems    Objective    BP 131/88 (Cuff Size: Large)   Pulse (!) 113   Ht 5\' 4"  (1.626 m)   Wt 232 lb (105.2 kg)   SpO2 100%   BMI 39.82 kg/m    Physical Exam Vitals reviewed.  Constitutional:      General: She is not in acute distress.    Appearance: Normal appearance. She is well-developed. She is not diaphoretic.  HENT:     Head: Normocephalic and atraumatic.     Right Ear: External ear normal.     Left Ear: External ear normal.     Nose: Nose normal.     Mouth/Throat:     Mouth: Mucous membranes are moist.     Pharynx: Oropharynx is clear. No oropharyngeal exudate.  Eyes:     General: No scleral icterus.    Conjunctiva/sclera: Conjunctivae normal.  Neck:     Thyroid: No thyromegaly.  Cardiovascular:     Rate and Rhythm: Normal rate and regular rhythm.     Heart sounds: Normal heart sounds. No murmur heard. Pulmonary:     Effort: Pulmonary effort is normal. No respiratory distress.     Breath sounds: Normal breath sounds. No wheezing or rales.  Abdominal:     General: There is  no distension.     Palpations: Abdomen is soft.     Tenderness: There is no abdominal tenderness.  Musculoskeletal:        General: No deformity.     Cervical back: Neck supple.     Right lower leg: No edema.     Left lower leg: No edema.  Lymphadenopathy:     Cervical: No  cervical adenopathy.  Skin:    General: Skin is warm and dry.     Findings: No rash.  Neurological:     Mental Status: She is alert and oriented to person, place, and time. Mental status is at baseline.     Gait: Gait normal.  Psychiatric:        Mood and Affect: Mood normal.        Behavior: Behavior normal.        Thought Content: Thought content normal.      No results found for any visits on 02/23/24.  Assessment & Plan    Routine Health Maintenance and Physical Exam  Exercise Activities and Dietary recommendations  Goals      Exercise 150 minutes per week (moderate activity)        Immunization History  Administered Date(s) Administered   Influenza-Unspecified 09/10/2014, 10/14/2015, 09/07/2016, 09/13/2017, 10/04/2017, 09/14/2018, 08/29/2019, 09/23/2020, 10/07/2021, 09/24/2022, 09/15/2023   PFIZER Comirnaty(Gray Top)Covid-19 Tri-Sucrose Vaccine 04/29/2021   PFIZER(Purple Top)SARS-COV-2 Vaccination 02/14/2020, 03/06/2020, 10/06/2020   PNEUMOCOCCAL CONJUGATE-20 02/23/2024   Pfizer Covid-19 Vaccine Bivalent Booster 84yrs & up 12/21/2021   Pfizer(Comirnaty)Fall Seasonal Vaccine 12 years and older 02/23/2023   Pneumococcal Polysaccharide-23 10/15/2011, 01/14/2020   Td 01/03/2017   Tdap 03/10/2006   Zoster Recombinant(Shingrix) 01/16/2021, 03/27/2021    Health Maintenance  Topic Date Due   COVID-19 Vaccine (7 - 2024-25 season) 08/14/2023   Diabetic kidney evaluation - Urine ACR  02/21/2024   HEMOGLOBIN A1C  02/26/2024   OPHTHALMOLOGY EXAM  03/01/2024   Diabetic kidney evaluation - eGFR measurement  08/28/2024   MAMMOGRAM  02/21/2025   FOOT EXAM  02/22/2025   DTaP/Tdap/Td (3 - Td or Tdap)  01/03/2027   Cervical Cancer Screening (HPV/Pap Cotest)  02/16/2027   Colonoscopy  03/04/2030   Pneumococcal Vaccine 6-28 Years old  Completed   INFLUENZA VACCINE  Completed   Hepatitis C Screening  Completed   HIV Screening  Completed   Zoster Vaccines- Shingrix  Completed   HPV VACCINES  Aged Out    Discussed health benefits of physical activity, and encouraged her to engage in regular exercise appropriate for her age and condition.  Problem List Items Addressed This Visit       Cardiovascular and Mediastinum   Hypertension associated with diabetes (HCC)   Blood pressure is well-controlled on ramipril and triamterene HCTZ.      Relevant Medications   tirzepatide (MOUNJARO) 7.5 MG/0.5ML Pen   Other Relevant Orders   Comprehensive metabolic panel     Endocrine   Diabetes mellitus, type 2 (HCC)   She is on Mounjaro 5 mg weekly with inadequate weight loss and appetite suppression. Frequent urination and incontinence suggest suboptimal glycemic control. - Increase Mounjaro to 7.5 mg weekly to improve glycemic control and aid in weight loss - continue metformin at current dose - UACR and foot exam today - upcoming eye exam - Monitor A1c levels - Plan follow-up in 6 months if A1c is stable; consider 94-month follow-up if A1c is elevated      Relevant Medications   tirzepatide (MOUNJARO) 7.5 MG/0.5ML Pen   Other Relevant Orders   Urine microalbumin-creatinine with uACR   Comprehensive metabolic panel   Lipid Panel With LDL/HDL Ratio   Hemoglobin A1c   Pneumococcal conjugate vaccine 20-valent (Completed)   Hyperlipidemia associated with type 2 diabetes mellitus (HCC)   She is on simvastatin 10 mg daily. Continue. Recheck FLP      Relevant Medications   tirzepatide (MOUNJARO) 7.5  MG/0.5ML Pen   Other Relevant Orders   Urine microalbumin-creatinine with uACR   Comprehensive metabolic panel   Lipid Panel With LDL/HDL Ratio   Hemoglobin A1c     Other   Obesity (BMI  30-39.9)   Increase mounjaro as above Discussed importance of healthy weight management Discussed diet and exercise       Relevant Medications   tirzepatide (MOUNJARO) 7.5 MG/0.5ML Pen   Other Relevant Orders   Comprehensive metabolic panel   Hemoglobin A1c   Other Visit Diagnoses       Encounter for annual physical exam    -  Primary   Relevant Orders   Urine microalbumin-creatinine with uACR   Comprehensive metabolic panel   Lipid Panel With LDL/HDL Ratio   Hemoglobin A1c   Pneumococcal conjugate vaccine 20-valent (Completed)     Breast cancer screening by mammogram       Relevant Orders   MM 3D SCREENING MAMMOGRAM BILATERAL BREAST           General Health Maintenance She is up to date on most vaccinations and screenings. She is due for a mammogram and has an upcoming eye exam. The pneumonia vaccination is planned for today, and this will be her last required dose. Tetanus is not due until 2028, and she is done with shingles vaccinations. Pap smear is up to date until 2028, and colonoscopy is due in 2031. A foot exam is planned for today. - Administer pneumonia vaccination - Order mammogram and instruct her to schedule - Perform foot exam - Schedule eye exam - Plan follow-up in 6 months        Return in about 6 months (around 08/25/2024) for chronic disease f/u.     [name], MD  [facility] [phone] (phone) [phone] (fax)  [facility]

## 2024-02-24 ENCOUNTER — Encounter: Payer: Managed Care, Other (non HMO) | Admitting: Family Medicine

## 2024-02-24 LAB — LIPID PANEL WITH LDL/HDL RATIO
Cholesterol, Total: 174 mg/dL (ref 100–199)
HDL: 78 mg/dL (ref >39)
LDL Chol Calc (NIH): 81 mg/dL (ref 0–99)
LDL/HDL Ratio: 1 ratio (ref 0.0–3.2)
Triglycerides: 83 mg/dL (ref 0–149)
VLDL Cholesterol Cal: 15 mg/dL (ref 5–40)

## 2024-02-24 LAB — COMPREHENSIVE METABOLIC PANEL
ALT: 23 IU/L (ref 0–32)
AST: 22 IU/L (ref 0–40)
Albumin: 4.5 g/dL (ref 3.8–4.9)
Alkaline Phosphatase: 101 IU/L (ref 44–121)
BUN/Creatinine Ratio: 13 (ref 9–23)
BUN: 14 mg/dL (ref 6–24)
Bilirubin Total: <0.2 mg/dL (ref 0.0–1.2)
CO2: 21 mmol/L (ref 20–29)
Calcium: 9.8 mg/dL (ref 8.7–10.2)
Chloride: 103 mmol/L (ref 96–106)
Creatinine, Ser: 1.06 mg/dL — ABNORMAL HIGH (ref 0.57–1.00)
Globulin, Total: 2.9 g/dL (ref 1.5–4.5)
Glucose: 177 mg/dL — ABNORMAL HIGH (ref 70–99)
Potassium: 4.3 mmol/L (ref 3.5–5.2)
Sodium: 145 mmol/L — ABNORMAL HIGH (ref 134–144)
Total Protein: 7.4 g/dL (ref 6.0–8.5)
eGFR: 63 mL/min/{1.73_m2} (ref >59)

## 2024-02-24 LAB — MICROALBUMIN / CREATININE URINE RATIO
Creatinine, Urine: 85.7 mg/dL
Microalb/Creat Ratio: 7 mg/g{creat} (ref 0–29)
Microalbumin, Urine: 6 ug/mL

## 2024-02-24 LAB — HEMOGLOBIN A1C
Est. average glucose Bld gHb Est-mCnc: 151 mg/dL
Hgb A1c MFr Bld: 6.9 % — ABNORMAL HIGH (ref 4.8–5.6)

## 2024-02-27 ENCOUNTER — Telehealth: Payer: Self-pay

## 2024-02-27 ENCOUNTER — Other Ambulatory Visit (HOSPITAL_COMMUNITY): Payer: Self-pay

## 2024-02-27 NOTE — Telephone Encounter (Signed)
*  Primary  Pharmacy Patient Advocate Encounter   Received notification from Fax that prior authorization for Mounjaro 7.5MG /0.5ML auto-injectors  is required/requested.   Insurance verification completed.   The patient is insured through [employer] .   Per test claim: PA required; PA submitted to above mentioned insurance via CoverMyMeds Key/confirmation #/EOC [id number] Status is pending

## 2024-02-29 ENCOUNTER — Other Ambulatory Visit (HOSPITAL_COMMUNITY): Payer: Self-pay

## 2024-02-29 ENCOUNTER — Ambulatory Visit
Admission: RE | Admit: 2024-02-29 | Discharge: 2024-02-29 | Disposition: A | Discharge status: Home / Self Care | Source: Ambulatory Visit | Attending: Family Medicine | Admitting: Family Medicine

## 2024-02-29 DIAGNOSIS — Z1231 Encounter for screening mammogram for malignant neoplasm of breast: Secondary | ICD-10-CM | POA: Diagnosis present

## 2024-03-01 ENCOUNTER — Other Ambulatory Visit (HOSPITAL_COMMUNITY): Payer: Self-pay

## 2024-03-05 ENCOUNTER — Other Ambulatory Visit (HOSPITAL_COMMUNITY): Payer: Self-pay

## 2024-03-09 ENCOUNTER — Encounter: Payer: Self-pay | Admitting: Family Medicine

## 2024-03-09 ENCOUNTER — Other Ambulatory Visit: Payer: Self-pay | Admitting: Family Medicine

## 2024-03-09 DIAGNOSIS — E1122 Type 2 diabetes mellitus with diabetic chronic kidney disease: Secondary | ICD-10-CM

## 2024-03-09 NOTE — Telephone Encounter (Signed)
 [id number];TKZSWF:[id number];Review Type:Prior Auth;Coverage Start Date:02/27/2024;Coverage End Date:03/03/2025;;CaseId:[id number];Status:Denied;Review Type:Qty;Appeal Information:;. Authorization Expiration Date: March 03, 2025.

## 2024-03-23 ENCOUNTER — Encounter: Payer: Self-pay | Admitting: Family Medicine

## 2024-03-23 LAB — BASIC METABOLIC PANEL WITH GFR
BUN/Creatinine Ratio: 15 (ref 9–23)
BUN: 14 mg/dL (ref 6–24)
CO2: 22 mmol/L (ref 20–29)
Calcium: 9.8 mg/dL (ref 8.7–10.2)
Chloride: 101 mmol/L (ref 96–106)
Creatinine, Ser: 0.95 mg/dL (ref 0.57–1.00)
Glucose: 188 mg/dL — ABNORMAL HIGH (ref 70–99)
Potassium: 4.4 mmol/L (ref 3.5–5.2)
Sodium: 143 mmol/L (ref 134–144)
eGFR: 71 mL/min/{1.73_m2} (ref >59)

## 2024-04-09 ENCOUNTER — Encounter: Payer: Self-pay | Admitting: Family Medicine

## 2024-04-09 DIAGNOSIS — E1122 Type 2 diabetes mellitus with diabetic chronic kidney disease: Secondary | ICD-10-CM

## 2024-04-09 DIAGNOSIS — E669 Obesity, unspecified: Secondary | ICD-10-CM

## 2024-04-10 MED ORDER — TIRZEPATIDE 10 MG/0.5ML ~~LOC~~ SOAJ: 10.0000 mg | SUBCUTANEOUS | 1 refills | Status: DC

## 2024-04-10 NOTE — Telephone Encounter (Signed)
 Ok to send in mounjaro  10mg  weekly 90 day supply with 1 refill

## 2024-04-23 ENCOUNTER — Other Ambulatory Visit: Payer: Self-pay | Admitting: Family Medicine

## 2024-04-23 DIAGNOSIS — I10 Essential (primary) hypertension: Secondary | ICD-10-CM

## 2024-04-23 MED ORDER — TRIAMTERENE-HCTZ 37.5-25 MG PO TABS: 1.0000 | ORAL_TABLET | Freq: Every day | ORAL | 0 refills | Status: DC

## 2024-05-07 ENCOUNTER — Other Ambulatory Visit: Payer: Self-pay | Admitting: Family Medicine

## 2024-05-07 DIAGNOSIS — E1169 Type 2 diabetes mellitus with other specified complication: Secondary | ICD-10-CM

## 2024-06-06 ENCOUNTER — Other Ambulatory Visit (HOSPITAL_COMMUNITY): Payer: Self-pay

## 2024-06-08 ENCOUNTER — Other Ambulatory Visit: Payer: Self-pay

## 2024-06-08 ENCOUNTER — Telehealth: Payer: Self-pay | Admitting: Family Medicine

## 2024-06-08 DIAGNOSIS — E7800 Pure hypercholesterolemia, unspecified: Secondary | ICD-10-CM

## 2024-06-08 MED ORDER — SIMVASTATIN 10 MG PO TABS: ORAL_TABLET | ORAL | 1 refills | Status: DC

## 2024-06-08 NOTE — Telephone Encounter (Signed)
Converted to refill request 

## 2024-06-08 NOTE — Telephone Encounter (Signed)
 [facility] pharmacy is requesting refill simvastatin  (ZOCOR ) 10 MG tablet  Please advise

## 2024-08-28 ENCOUNTER — Encounter: Payer: Self-pay | Admitting: Family Medicine

## 2024-08-28 ENCOUNTER — Ambulatory Visit: Admitting: Family Medicine

## 2024-08-28 VITALS — BP 115/82 | HR 107 | Ht 64.0 in | Wt 216.4 lb

## 2024-08-28 DIAGNOSIS — I152 Hypertension secondary to endocrine disorders: Secondary | ICD-10-CM | POA: Diagnosis not present

## 2024-08-28 DIAGNOSIS — E1169 Type 2 diabetes mellitus with other specified complication: Secondary | ICD-10-CM | POA: Diagnosis not present

## 2024-08-28 DIAGNOSIS — E1159 Type 2 diabetes mellitus with other circulatory complications: Secondary | ICD-10-CM

## 2024-08-28 DIAGNOSIS — E669 Obesity, unspecified: Secondary | ICD-10-CM | POA: Diagnosis not present

## 2024-08-28 DIAGNOSIS — E785 Hyperlipidemia, unspecified: Secondary | ICD-10-CM

## 2024-08-28 LAB — POCT GLYCOSYLATED HEMOGLOBIN (HGB A1C): Hemoglobin A1C: 5.8 % — AB (ref 4.0–5.6)

## 2024-08-28 NOTE — Assessment & Plan Note (Signed)
 Type 2 diabetes mellitus is well-controlled with an A1c of 5.8%. Current regimen of Mounjaro , metformin , and Jardiance  is effective. Potential hypoglycemia is a concern if blood sugar levels drop too low, but current A1c levels are not concerning for hypoglycemia. - Continue Mounjaro  10 mg weekly, metformin  1000 mg BID, Jardiance  25 mg daily. - Monitor for symptoms of hypoglycemia such as shakes or feeling faint. Consider adjusting medication regimen if these occur. - Order kidney and liver function tests. - ROI for last eye exam

## 2024-08-28 NOTE — Progress Notes (Signed)
 Established patient visit   Patient: Carolyn Nolan   DOB: [birth year]   53 y.o. Female  MRN: [id number] Visit Date: 08/28/2024  Today's healthcare provider: Jon Eva, MD   Chief Complaint  Patient presents with   Medical Management of Chronic Issues    She reports taking medications as prescribed with no side effects to report. She reports no symptoms   Hypertension   Diabetes   Hyperlipidemia   Subjective     HPI     Medical Management of Chronic Issues    Additional comments: She reports taking medications as prescribed with no side effects to report. She reports no symptoms      Last edited by Lilian Fitzpatrick, CMA on 08/28/2024  1:23 PM.       Discussed the use of AI scribe software for clinical note transcription with the patient, who gave verbal consent to proceed.  History of Present Illness   Carolyn Nolan is a 53 year old female with hypertension, hyperlipidemia, type 2 diabetes, and obesity who presents for chronic follow-up.  Her A1c has improved to 5.8, and she has lost 16 pounds over the last six months. She is currently taking Jardiance  25 mg daily, metformin  1000 mg twice daily, and Mounjaro  10 mg weekly for type 2 diabetes. Mounjaro  affects her appetite, leading to early satiety and reduced hunger. She does not monitor her blood sugar at home but is aware of hypoglycemia symptoms, having experienced them once.  For hypertension, she is on ramipril  2.5 mg daily and triamterene -HCTZ 37.5-25 mg daily. Her hyperlipidemia is managed with simvastatin  10 mg daily, and her cholesterol levels are in the low 80s. She takes aspirin 81 mg daily. She sometimes experiences leg swelling after prolonged sitting or standing, but there is no swelling today.  She plans to receive her flu shot at work where it is offered for free.         Medications: Outpatient Medications Prior to Visit  Medication Sig   Ascorbic Acid (VITAMIN C) 1000 MG tablet Take 1,000  mg by mouth daily.   aspirin 81 MG tablet BAYER LOW STRENGTH, 81MG  (Oral Tablet Delayed Release)  1 Every Day for 0 days  Quantity: 0.00;  Refills: 0   Ordered :05-Oct-2010  Merilee Krabbe ;  Started 09-May-2007 Active Comments: DX: 250.00   azelastine (ASTELIN) 0.1 % nasal spray instill 1 spray into each nostril twice a day   benzoyl peroxide-erythromycin (BENZAMYCIN) gel APP TO FACE QAM UTD   Calcium  Carbonate-Vitamin D 600-200 MG-UNIT CAPS CALCIUM  + D, 600-200MG -UNIT (Oral Tablet)  1 Twice Daily for 0 days  Quantity: 0.00;  Refills: 0   Ordered :05-Oct-2010  Hope Doffing ;  Started 03-July-2010 Active   Cetirizine HCl 10 MG CAPS Take by mouth.   Collagen-Boron-Hyaluronic Acid (MOVE FREE ULTRA JOINT HEALTH) 40-5-3.3 MG TABS Take 1 capsule by mouth daily.   cyclobenzaprine  (FLEXERIL ) 5 MG tablet TAKE 1 TABLET BY MOUTH AT BEDTIME AS NEEDED FOR MUSCLE SPASMS   ferrous sulfate 325 (65 FE) MG tablet Take by mouth.   gabapentin (NEURONTIN) 100 MG capsule Take 300 mg by mouth. Take 300 mg during the day and 200 mg at night   glucosamine-chondroitin 500-400 MG tablet Take 1 tablet by mouth 3 (three) times daily.   IRON-VITAMINS PO Take by mouth.   JARDIANCE  25 MG TABS tablet TAKE 1 TABLET DAILY BEFORE BREAKFAST   MAGNESIUM PO Take 400 mg by mouth daily in the afternoon.  metFORMIN  (GLUCOPHAGE ) 1000 MG tablet Take 1 tablet (1,000 mg total) by mouth 2 (two) times daily with a meal.   montelukast  (SINGULAIR ) 10 MG tablet take 1 tablet by mouth at bedtime   MULTIPLE VITAMIN PO MULTIVITAMINS (Oral Tablet)  1 Every Day for 0 days  Quantity: 0.00;  Refills: 0   Ordered :05-Oct-2010  Hope Doffing ;  Started 18-Mar-2010 Active Comments: DX: 250.00   nortriptyline (PAMELOR) 10 MG capsule Take 1 capsule by mouth every morning.   nortriptyline (PAMELOR) 50 MG capsule Take 50 mg by mouth at bedtime.   potassium gluconate 595 (99 K) MG TABS tablet Take by mouth.   ramipril  (ALTACE ) 2.5 MG capsule TAKE 1  CAPSULE(2.5 MG) BY MOUTH DAILY   simvastatin  (ZOCOR ) 10 MG tablet TAKE 1 TABLET(10 MG) BY MOUTH AT BEDTIME   tirzepatide  (MOUNJARO ) 10 MG/0.5ML Pen Inject 10 mg into the skin once a week.   Tretinoin Microsphere 0.08 % GEL Apply topically at bedtime.   triamterene -hydrochlorothiazide (MAXZIDE-25) 37.5-25 MG tablet Take 1 tablet by mouth daily.   vitamin B-12 (CYANOCOBALAMIN) 1000 MCG tablet Take 1,000 mcg by mouth daily.   Zinc 30 MG TABS Take by mouth.   No facility-administered medications prior to visit.    Review of Systems     Objective    BP 115/82   Pulse (!) 107   Ht 5' 4 (1.626 m)   Wt 216 lb 6.4 oz (98.2 kg)   SpO2 99%   BMI 37.14 kg/m  Wt Readings from Last 3 Encounters:  08/28/24 216 lb 6.4 oz (98.2 kg)  02/23/24 232 lb (105.2 kg)  08/29/23 231 lb 14.4 oz (105.2 kg)      Physical Exam Vitals reviewed.  Constitutional:      General: She is not in acute distress.    Appearance: Normal appearance. She is well-developed. She is not diaphoretic.  HENT:     Head: Normocephalic and atraumatic.  Eyes:     General: No scleral icterus.    Conjunctiva/sclera: Conjunctivae normal.  Neck:     Thyroid: No thyromegaly.  Cardiovascular:     Rate and Rhythm: Normal rate and regular rhythm.     Heart sounds: Normal heart sounds. No murmur heard. Pulmonary:     Effort: Pulmonary effort is normal. No respiratory distress.     Breath sounds: Normal breath sounds. No wheezing, rhonchi or rales.  Musculoskeletal:     Cervical back: Neck supple.     Right lower leg: No edema.     Left lower leg: No edema.  Lymphadenopathy:     Cervical: No cervical adenopathy.  Skin:    General: Skin is warm and dry.     Findings: No rash.  Neurological:     Mental Status: She is alert and oriented to person, place, and time. Mental status is at baseline.  Psychiatric:        Mood and Affect: Mood normal.        Behavior: Behavior normal.      Results for orders placed or  performed in visit on 08/28/24  POCT HgB A1C  Result Value Ref Range   Hemoglobin A1C 5.8 (A) 4.0 - 5.6 %   HbA1c POC (<> result, manual entry)     HbA1c, POC (prediabetic range)     HbA1c, POC (controlled diabetic range)      Assessment & Plan     Problem List Items Addressed This Visit       Cardiovascular and  Mediastinum   Hypertension associated with diabetes (HCC) - Primary   Well controlled Continue ramipril  2.5 mg daily, triamterene -HCTZ 37.5-25 mg daily.      Relevant Orders   Comprehensive metabolic panel with GFR     Endocrine   Diabetes mellitus, type 2 (HCC)   Type 2 diabetes mellitus is well-controlled with an A1c of 5.8%. Current regimen of Mounjaro , metformin , and Jardiance  is effective. Potential hypoglycemia is a concern if blood sugar levels drop too low, but current A1c levels are not concerning for hypoglycemia. - Continue Mounjaro  10 mg weekly, metformin  1000 mg BID, Jardiance  25 mg daily. - Monitor for symptoms of hypoglycemia such as shakes or feeling faint. Consider adjusting medication regimen if these occur. - Order kidney and liver function tests. - ROI for last eye exam      Relevant Orders   POCT HgB A1C (Completed)   Hyperlipidemia associated with type 2 diabetes mellitus (HCC)   Hyperlipidemia is managed with simvastatin . Current LDL levels are in the low 19d, with a goal to reduce LDL to below 70 due to diabetes. Weight loss may contribute to further reduction in cholesterol levels. - Continue simvastatin  10 mg daily. - Order cholesterol test to assess current levels and determine if further medication adjustments are needed.       Relevant Orders   Comprehensive metabolic panel with GFR   Lipid panel     Other   Obesity (BMI 30-39.9)   Obesity management is progressing well with a weight loss of 16 pounds over the last six months. Mounjaro  is contributing to decreased appetite and earlier satiety, aiding in weight loss. - Continue  Mounjaro  10 mg weekly. - Encourage continued monitoring of weight loss progress.        Return in about 6 months (around 02/25/2025) for CPE.       Jon Eva, MD  [facility] [phone] (phone) [phone] (fax)  [facility]

## 2024-08-28 NOTE — Assessment & Plan Note (Signed)
 Hyperlipidemia is managed with simvastatin . Current LDL levels are in the low 19d, with a goal to reduce LDL to below 70 due to diabetes. Weight loss may contribute to further reduction in cholesterol levels. - Continue simvastatin  10 mg daily. - Order cholesterol test to assess current levels and determine if further medication adjustments are needed.

## 2024-08-28 NOTE — Assessment & Plan Note (Signed)
 Well controlled Continue ramipril  2.5 mg daily, triamterene -HCTZ 37.5-25 mg daily.

## 2024-08-28 NOTE — Assessment & Plan Note (Signed)
 Obesity management is progressing well with a weight loss of 16 pounds over the last six months. Mounjaro  is contributing to decreased appetite and earlier satiety, aiding in weight loss. - Continue Mounjaro  10 mg weekly. - Encourage continued monitoring of weight loss progress.

## 2024-08-29 ENCOUNTER — Ambulatory Visit: Payer: Self-pay | Admitting: Family Medicine

## 2024-08-29 LAB — COMPREHENSIVE METABOLIC PANEL WITH GFR
ALT: 36 IU/L — ABNORMAL HIGH (ref 0–32)
AST: 21 IU/L (ref 0–40)
Albumin: 4.5 g/dL (ref 3.8–4.9)
Alkaline Phosphatase: 106 IU/L (ref 49–135)
BUN/Creatinine Ratio: 12 (ref 9–23)
BUN: 11 mg/dL (ref 6–24)
Bilirubin Total: 0.3 mg/dL (ref 0.0–1.2)
CO2: 24 mmol/L (ref 20–29)
Calcium: 10 mg/dL (ref 8.7–10.2)
Chloride: 100 mmol/L (ref 96–106)
Creatinine, Ser: 0.95 mg/dL (ref 0.57–1.00)
Globulin, Total: 3 g/dL (ref 1.5–4.5)
Glucose: 110 mg/dL — ABNORMAL HIGH (ref 70–99)
Potassium: 4.4 mmol/L (ref 3.5–5.2)
Sodium: 141 mmol/L (ref 134–144)
Total Protein: 7.5 g/dL (ref 6.0–8.5)
eGFR: 71 mL/min/1.73 (ref >59)

## 2024-08-29 LAB — LIPID PANEL
Chol/HDL Ratio: 2.3 ratio (ref 0.0–4.4)
Cholesterol, Total: 171 mg/dL (ref 100–199)
HDL: 76 mg/dL (ref >39)
LDL Chol Calc (NIH): 81 mg/dL (ref 0–99)
Triglycerides: 74 mg/dL (ref 0–149)
VLDL Cholesterol Cal: 14 mg/dL (ref 5–40)

## 2024-08-30 MED ORDER — ROSUVASTATIN CALCIUM 5 MG PO TABS: 5.0000 mg | ORAL_TABLET | Freq: Every day | ORAL | 3 refills | Status: AC

## 2024-09-11 ENCOUNTER — Other Ambulatory Visit: Payer: Self-pay | Admitting: Family Medicine

## 2024-09-11 DIAGNOSIS — I10 Essential (primary) hypertension: Secondary | ICD-10-CM

## 2024-09-12 NOTE — Telephone Encounter (Signed)
 Requested Prescriptions  Pending Prescriptions Disp Refills   triamterene -hydrochlorothiazide (MAXZIDE-25) 37.5-25 MG tablet [Pharmacy Med Name: TRIAMTERENE  37.5MG / HCTZ25MG  TABS] 90 tablet 1    Sig: TAKE 1 TABLET BY MOUTH DAILY     Cardiovascular: Diuretic Combos Passed - 09/12/2024  4:19 PM      Passed - K in normal range and within 180 days    Potassium  Date Value Ref Range Status  08/28/2024 4.4 3.5 - 5.2 mmol/L Final         Passed - Na in normal range and within 180 days    Sodium  Date Value Ref Range Status  08/28/2024 141 134 - 144 mmol/L Final         Passed - Cr in normal range and within 180 days    Creatinine, Ser  Date Value Ref Range Status  08/28/2024 0.95 0.57 - 1.00 mg/dL Final         Passed - Last BP in normal range    BP Readings from Last 1 Encounters:  08/28/24 115/82         Passed - Valid encounter within last 6 months    Recent Outpatient Visits           2 weeks ago Hypertension associated with diabetes [facility])   [facility] [place], Jon HERO, MD   6 months ago Encounter for annual physical exam   [facility] [place], Jon HERO, MD

## 2024-09-23 ENCOUNTER — Other Ambulatory Visit: Payer: Self-pay | Admitting: Family Medicine

## 2024-09-23 DIAGNOSIS — E669 Obesity, unspecified: Secondary | ICD-10-CM

## 2024-09-23 DIAGNOSIS — E1122 Type 2 diabetes mellitus with diabetic chronic kidney disease: Secondary | ICD-10-CM

## 2024-10-01 ENCOUNTER — Encounter: Payer: Self-pay | Admitting: Family Medicine

## 2024-12-11 ENCOUNTER — Telehealth: Payer: Self-pay | Admitting: Family Medicine

## 2024-12-11 DIAGNOSIS — I10 Essential (primary) hypertension: Secondary | ICD-10-CM

## 2024-12-11 MED ORDER — RAMIPRIL 2.5 MG PO CAPS: ORAL_CAPSULE | ORAL | 1 refills | Status: AC

## 2024-12-11 NOTE — Telephone Encounter (Signed)
 [facility] faxed refill request for the following medications:  ramipril  (ALTACE ) 2.5 MG capsule     Please advise.

## 2025-03-04 ENCOUNTER — Encounter: Admitting: Family Medicine
